# Patient Record
Sex: Female | Born: 1940 | Race: White | Hispanic: No | Marital: Married | Smoking: Never smoker
Health system: Southern US, Community
[De-identification: ages and names within clinical notes are randomized; demographics above are authoritative.]

## PROBLEM LIST (undated history)

## (undated) ENCOUNTER — Emergency Department: Discharge status: Absent without leave | Payer: Medicare HMO

## (undated) DIAGNOSIS — R200 Anesthesia of skin: Secondary | ICD-10-CM

## (undated) DIAGNOSIS — R519 Headache, unspecified: Secondary | ICD-10-CM

## (undated) DIAGNOSIS — T884XXA Failed or difficult intubation, initial encounter: Secondary | ICD-10-CM

## (undated) DIAGNOSIS — Z8709 Personal history of other diseases of the respiratory system: Secondary | ICD-10-CM

## (undated) DIAGNOSIS — I10 Essential (primary) hypertension: Secondary | ICD-10-CM

## (undated) DIAGNOSIS — D649 Anemia, unspecified: Secondary | ICD-10-CM

## (undated) DIAGNOSIS — R202 Paresthesia of skin: Secondary | ICD-10-CM

## (undated) DIAGNOSIS — T8859XA Other complications of anesthesia, initial encounter: Secondary | ICD-10-CM

## (undated) DIAGNOSIS — K219 Gastro-esophageal reflux disease without esophagitis: Secondary | ICD-10-CM

## (undated) DIAGNOSIS — R51 Headache: Secondary | ICD-10-CM

## (undated) DIAGNOSIS — E871 Hypo-osmolality and hyponatremia: Secondary | ICD-10-CM

## (undated) DIAGNOSIS — G2581 Restless legs syndrome: Secondary | ICD-10-CM

## (undated) DIAGNOSIS — R32 Unspecified urinary incontinence: Secondary | ICD-10-CM

## (undated) DIAGNOSIS — T4145XA Adverse effect of unspecified anesthetic, initial encounter: Secondary | ICD-10-CM

## (undated) DIAGNOSIS — G629 Polyneuropathy, unspecified: Secondary | ICD-10-CM

## (undated) DIAGNOSIS — M1990 Unspecified osteoarthritis, unspecified site: Secondary | ICD-10-CM

## (undated) DIAGNOSIS — F329 Major depressive disorder, single episode, unspecified: Secondary | ICD-10-CM

## (undated) DIAGNOSIS — E039 Hypothyroidism, unspecified: Secondary | ICD-10-CM

## (undated) DIAGNOSIS — Z8701 Personal history of pneumonia (recurrent): Secondary | ICD-10-CM

## (undated) DIAGNOSIS — E7800 Pure hypercholesterolemia, unspecified: Secondary | ICD-10-CM

## (undated) DIAGNOSIS — F419 Anxiety disorder, unspecified: Secondary | ICD-10-CM

## (undated) DIAGNOSIS — F32A Depression, unspecified: Secondary | ICD-10-CM

## (undated) DIAGNOSIS — R3915 Urgency of urination: Secondary | ICD-10-CM

## (undated) HISTORY — PX: TONSILLECTOMY: SUR1361

## (undated) HISTORY — PX: EYE SURGERY: SHX253

## (undated) HISTORY — PX: ABDOMINAL HYSTERECTOMY: SHX81

## (undated) HISTORY — PX: BREAST LUMPECTOMY: SHX2

## (undated) HISTORY — PX: SPINAL FUSION: SHX223

## (undated) HISTORY — PX: KNEE ARTHROSCOPY: SUR90

## (undated) HISTORY — PX: CERVICAL FUSION: SHX112

## (undated) HISTORY — PX: COLONOSCOPY: SHX174

## (undated) HISTORY — PX: OTHER SURGICAL HISTORY: SHX169

---

## 2003-02-13 ENCOUNTER — Ambulatory Visit (HOSPITAL_BASED_OUTPATIENT_CLINIC_OR_DEPARTMENT_OTHER): Admission: RE | Admit: 2003-02-13 | Discharge: 2003-02-13 | Payer: Self-pay | Admitting: Orthopaedic Surgery

## 2008-05-08 ENCOUNTER — Emergency Department (HOSPITAL_BASED_OUTPATIENT_CLINIC_OR_DEPARTMENT_OTHER): Admission: EM | Admit: 2008-05-08 | Discharge: 2008-05-08 | Payer: Self-pay | Admitting: Emergency Medicine

## 2011-01-23 NOTE — Op Note (Signed)
[name], Stacy Stanley                           ACCOUNT NO.:  [id number]   MEDICAL RECORD NO.:  [id number]                   PATIENT TYPE:  AMB   LOCATION:  DSC                                  FACILITY:  [facility]   PHYSICIAN:  Lubertha Basque. Jerl Santos, M.D.             DATE OF BIRTH:  1940-11-16   DATE OF PROCEDURE:  02/13/2003  DATE OF DISCHARGE:                                 OPERATIVE REPORT   PREOPERATIVE DIAGNOSES:  1. Right knee torn medial and torn lateral meniscus.  2. Right knee chondromalacia of the patella.   POSTOPERATIVE DIAGNOSES:  1. Right knee torn medial and torn lateral meniscus.  2. Right knee chondromalacia of the patella.   PROCEDURES:  1. Right knee partial medial and partial lateral meniscectomy.  2. Right knee arthroscopic chondroplasty of the patellofemoral compartment.   ANESTHESIA:  Knee block MAC.   ATTENDING SURGEON:  Lubertha Basque. Jerl Santos, M.D.   ASSISTANT:  Lindwood Qua, P.A.   INDICATION FOR PROCEDURE:  The patient is a 70 year old woman with several  months of intense right knee pain.  She has persisted with pain and an  effusion despite oral anti-inflammatories and rest.  She has undergone an  MRI scan, which does show medial and lateral meniscal tears and some  chondromalacia of the patella.  She also experienced some pain on the  opposite knee, and we evaluated that with an MRI scan which was relatively  normal.  She was offered an arthroscopy on the right with continued rest  pain and activity pain.  Informed operative consent was obtained after a  discussion of the possible complications of, reaction to anesthesia, and  infection.   DESCRIPTION OF PROCEDURE:  The patient was taken to the operating suite,  where a knee block was applied along with MAC.  She was positioned supine  and prepped and draped in the normal sterile fashion.  After administration  of preop IV antibiotics, an arthroscopy of the right knee was performed  through two  inferior portals.  The suprapatellar pouch was benign, while the  patellofemoral joint exhibited some grade 3 chondromalacia across the apex  of the patella.  A thorough chondroplasty was done to stable structures.  The kneecap did track very well.  In the medial compartment she had a flap  tear of the posterior horn of the medial meniscus, addressed with a 5%  partial medial meniscectomy.  She also had some grade 3 change across a  broad area of the medial femoral condyle, addressed with a brief  chondroplasty.  The lateral compartment exhibited no degenerative changes,  but she did have a significant anterior and middle horn lateral meniscal  tear, which appeared to displace into the joint.  This was addressed with a  20% partial lateral meniscectomy down to stable tissues.  The posterior horn  was completely benign.  The knee was thoroughly irrigated at the end of  the  case, followed by the placement of Marcaine with epinephrine and morphine.  Adaptic was placed over the wounds, followed by dry gauze and a loose Ace  wrap.  Estimated blood loss and intraoperative fluids can be obtained from  anesthesia records.    DISPOSITION:  The patient was taken to the recovery room in stable  condition.  Plans were for her to go home the same day and to follow up in  the office in less than a week.  I will contact her by phone tonight.                                               Lubertha Basque Jerl Santos, M.D.    PGD/MEDQ  D:  02/13/2003  T:  02/13/2003  Job:  [id number]

## 2016-06-26 ENCOUNTER — Other Ambulatory Visit: Payer: Self-pay | Admitting: Orthopaedic Surgery

## 2016-07-22 ENCOUNTER — Inpatient Hospital Stay (HOSPITAL_COMMUNITY)
Admission: RE | Admit: 2016-07-22 | Discharge: 2016-07-22 | Disposition: A | Discharge status: Home / Self Care | Payer: Self-pay | Source: Ambulatory Visit

## 2016-07-29 ENCOUNTER — Encounter (HOSPITAL_COMMUNITY)
Admission: RE | Admit: 2016-07-29 | Discharge: 2016-07-29 | Disposition: A | Discharge status: Home / Self Care | Payer: Medicare Other | Source: Ambulatory Visit | Attending: Orthopaedic Surgery | Admitting: Orthopaedic Surgery

## 2016-07-29 ENCOUNTER — Encounter (HOSPITAL_COMMUNITY): Payer: Self-pay

## 2016-07-29 ENCOUNTER — Ambulatory Visit (HOSPITAL_COMMUNITY)
Admission: RE | Admit: 2016-07-29 | Discharge: 2016-07-29 | Disposition: A | Discharge status: Home / Self Care | Payer: Medicare Other | Source: Ambulatory Visit | Attending: Orthopaedic Surgery | Admitting: Orthopaedic Surgery

## 2016-07-29 DIAGNOSIS — Z01818 Encounter for other preprocedural examination: Secondary | ICD-10-CM

## 2016-07-29 DIAGNOSIS — E039 Hypothyroidism, unspecified: Secondary | ICD-10-CM | POA: Insufficient documentation

## 2016-07-29 DIAGNOSIS — M1990 Unspecified osteoarthritis, unspecified site: Secondary | ICD-10-CM | POA: Insufficient documentation

## 2016-07-29 DIAGNOSIS — F329 Major depressive disorder, single episode, unspecified: Secondary | ICD-10-CM | POA: Diagnosis not present

## 2016-07-29 DIAGNOSIS — D649 Anemia, unspecified: Secondary | ICD-10-CM | POA: Diagnosis not present

## 2016-07-29 DIAGNOSIS — Z90710 Acquired absence of both cervix and uterus: Secondary | ICD-10-CM | POA: Diagnosis not present

## 2016-07-29 DIAGNOSIS — I517 Cardiomegaly: Secondary | ICD-10-CM | POA: Insufficient documentation

## 2016-07-29 DIAGNOSIS — I10 Essential (primary) hypertension: Secondary | ICD-10-CM | POA: Insufficient documentation

## 2016-07-29 DIAGNOSIS — R32 Unspecified urinary incontinence: Secondary | ICD-10-CM | POA: Diagnosis not present

## 2016-07-29 DIAGNOSIS — R05 Cough: Secondary | ICD-10-CM | POA: Insufficient documentation

## 2016-07-29 DIAGNOSIS — G629 Polyneuropathy, unspecified: Secondary | ICD-10-CM | POA: Diagnosis not present

## 2016-07-29 DIAGNOSIS — F419 Anxiety disorder, unspecified: Secondary | ICD-10-CM | POA: Insufficient documentation

## 2016-07-29 DIAGNOSIS — Z6841 Body Mass Index (BMI) 40.0 and over, adult: Secondary | ICD-10-CM | POA: Insufficient documentation

## 2016-07-29 DIAGNOSIS — E7800 Pure hypercholesterolemia, unspecified: Secondary | ICD-10-CM | POA: Insufficient documentation

## 2016-07-29 DIAGNOSIS — Z9889 Other specified postprocedural states: Secondary | ICD-10-CM | POA: Diagnosis not present

## 2016-07-29 HISTORY — DX: Major depressive disorder, single episode, unspecified: F32.9

## 2016-07-29 HISTORY — DX: Pure hypercholesterolemia, unspecified: E78.00

## 2016-07-29 HISTORY — DX: Anesthesia of skin: R20.0

## 2016-07-29 HISTORY — DX: Unspecified osteoarthritis, unspecified site: M19.90

## 2016-07-29 HISTORY — DX: Unspecified urinary incontinence: R32

## 2016-07-29 HISTORY — DX: Urgency of urination: R39.15

## 2016-07-29 HISTORY — DX: Restless legs syndrome: G25.81

## 2016-07-29 HISTORY — DX: Essential (primary) hypertension: I10

## 2016-07-29 HISTORY — DX: Failed or difficult intubation, initial encounter: T88.4XXA

## 2016-07-29 HISTORY — DX: Personal history of pneumonia (recurrent): Z87.01

## 2016-07-29 HISTORY — DX: Hypo-osmolality and hyponatremia: E87.1

## 2016-07-29 HISTORY — DX: Gastro-esophageal reflux disease without esophagitis: K21.9

## 2016-07-29 HISTORY — DX: Personal history of other diseases of the respiratory system: Z87.09

## 2016-07-29 HISTORY — DX: Headache, unspecified: R51.9

## 2016-07-29 HISTORY — DX: Depression, unspecified: F32.A

## 2016-07-29 HISTORY — DX: Anemia, unspecified: D64.9

## 2016-07-29 HISTORY — DX: Anxiety disorder, unspecified: F41.9

## 2016-07-29 HISTORY — DX: Hypothyroidism, unspecified: E03.9

## 2016-07-29 HISTORY — DX: Paresthesia of skin: R20.2

## 2016-07-29 HISTORY — DX: Polyneuropathy, unspecified: G62.9

## 2016-07-29 HISTORY — DX: Headache: R51

## 2016-07-29 LAB — BASIC METABOLIC PANEL
Anion gap: 8 (ref 5–15)
BUN: 39 mg/dL — ABNORMAL HIGH (ref 6–20)
CHLORIDE: 104 mmol/L (ref 101–111)
CO2: 26 mmol/L (ref 22–32)
Calcium: 9.9 mg/dL (ref 8.9–10.3)
Creatinine, Ser: 1.01 mg/dL — ABNORMAL HIGH (ref 0.44–1.00)
GFR, EST NON AFRICAN AMERICAN: 54 mL/min — AB (ref >60)
Glucose, Bld: 113 mg/dL — ABNORMAL HIGH (ref 65–99)
POTASSIUM: 4.5 mmol/L (ref 3.5–5.1)
SODIUM: 138 mmol/L (ref 135–145)

## 2016-07-29 LAB — CBC WITH DIFFERENTIAL/PLATELET
BASOS ABS: 0 10*3/uL (ref 0.0–0.1)
Basophils Relative: 0 %
EOS ABS: 0.2 10*3/uL (ref 0.0–0.7)
EOS PCT: 2 %
HCT: 35.8 % — ABNORMAL LOW (ref 36.0–46.0)
HEMOGLOBIN: 11.7 g/dL — AB (ref 12.0–15.0)
LYMPHS ABS: 3 10*3/uL (ref 0.7–4.0)
LYMPHS PCT: 27 %
MCH: 29.1 pg (ref 26.0–34.0)
MCHC: 32.7 g/dL (ref 30.0–36.0)
MCV: 89.1 fL (ref 78.0–100.0)
Monocytes Absolute: 1.1 10*3/uL — ABNORMAL HIGH (ref 0.1–1.0)
Monocytes Relative: 10 %
Neutro Abs: 6.7 10*3/uL (ref 1.7–7.7)
Neutrophils Relative %: 61 %
PLATELETS: 307 10*3/uL (ref 150–400)
RBC: 4.02 MIL/uL (ref 3.87–5.11)
RDW: 14.1 % (ref 11.5–15.5)
WBC: 11 10*3/uL — AB (ref 4.0–10.5)

## 2016-07-29 LAB — URINALYSIS, ROUTINE W REFLEX MICROSCOPIC
BILIRUBIN URINE: NEGATIVE
Glucose, UA: NEGATIVE mg/dL
HGB URINE DIPSTICK: NEGATIVE
KETONES UR: NEGATIVE mg/dL
Leukocytes, UA: NEGATIVE
NITRITE: NEGATIVE
PH: 6 (ref 5.0–8.0)
Protein, ur: NEGATIVE mg/dL
SPECIFIC GRAVITY, URINE: 1.011 (ref 1.005–1.030)

## 2016-07-29 LAB — TYPE AND SCREEN
ABO/RH(D): O POS
ANTIBODY SCREEN: NEGATIVE

## 2016-07-29 LAB — SURGICAL PCR SCREEN
MRSA, PCR: NEGATIVE
STAPHYLOCOCCUS AUREUS: NEGATIVE

## 2016-07-29 LAB — PROTIME-INR
INR: 1.02
PROTHROMBIN TIME: 13.4 s (ref 11.4–15.2)

## 2016-07-29 LAB — ABO/RH: ABO/RH(D): O POS

## 2016-07-29 LAB — APTT: APTT: 28 s (ref 24–36)

## 2016-07-29 NOTE — Progress Notes (Signed)
PCP - Dr. Josiah LoboJohn McFadden Cardiologist - denies Neurologist - Dr. Charmayne SheerEric Moser Urologist - Dr. Tobie Lordsimothy Mullins  EKG - 07/29/16 CXR - 07/29/16  Echo/Stress test/Cardiac Cath - denies  Patient denies chest pain and shortness of breath at PAT appointment.  Patient does states that she has developed a non-productive cough that worsens at night over the past 3-4 days.  She denies fever.  CXR was ordered by Dr. Jerl Santosalldorf.  Shonna ChockAllison Zelenak, PA consulted at PAT appointment.

## 2016-07-29 NOTE — Pre-Procedure Instructions (Signed)
Stacy HolsterLinda Stanley  07/29/2016      [id number] - [place], [place] - [address] [place] [id number] Phone: [phone] Fax: [phone]    Your procedure is scheduled on Tuesday, November 28th, 2017.  [facility]:30 A.M.   Call this number if you have problems the morning of surgery:  [phone]   Remember:  Do not eat food or drink liquids after midnight.   Take these medicines the morning of surgery with A SIP OF WATER: Acetaminophen (Tylenol) if needed, Baclofen (Lioresal) if needed, Buspirone (Buspar), Hydralazine (Apresoline), Labetalol (Normodyne), Levothyroxine (Synthroid), Loratadine (Claritin), Omeprazole (Prilosec), Pregabalin (Lyrica), Ropinirole (Requip), Tramadol (Ultram), Venlafaxine XR (Effexor XR).   Stop taking: Celecoxib (Celebrex), Aspirin, NSAIDS, Aleve, Naproxen, Ibuprofen, Advil, Motrin, BC's, Goody's, Fish oil, all herbal medications, and all vitamins.     Do not wear jewelry, make-up or nail polish.  Do not wear lotions, powders, or perfumes, or deoderant.  Do not shave 48 hours prior to surgery.    Do not bring valuables to the hospital.  [facility] is not responsible for any belongings or valuables.  Contacts, dentures or bridgework may not be worn into surgery.  Leave your suitcase in the car.  After surgery it may be brought to your room.  For patients admitted to the hospital, discharge time will be determined by your treatment team.  Patients discharged the day of surgery will not be allowed to drive home.   Special instructions:  Preparing for Surgery.   [facility]- Preparing For Surgery  Before surgery, you can play an important role. Because skin is not sterile, your skin needs to be as free of germs as possible. You can reduce the number of germs on your skin by washing with CHG (chlorahexidine gluconate) Soap before surgery.  CHG is  an antiseptic cleaner which kills germs and bonds with the skin to continue killing germs even after washing.  Please do not use if you have an allergy to CHG or antibacterial soaps. If your skin becomes reddened/irritated stop using the CHG.  Do not shave (including legs and underarms) for at least 48 hours prior to first CHG shower. It is OK to shave your face.  Please follow these instructions carefully.   1. Shower the NIGHT BEFORE SURGERY and the MORNING OF SURGERY with CHG.   2. If you chose to wash your hair, wash your hair first as usual with your normal shampoo.  3. After you shampoo, rinse your hair and body thoroughly to remove the shampoo.  4. Use CHG as you would any other liquid soap. You can apply CHG directly to the skin and wash gently with a scrungie or a clean washcloth.   5. Apply the CHG Soap to your body ONLY FROM THE NECK DOWN.  Do not use on open wounds or open sores. Avoid contact with your eyes, ears, mouth and genitals (private parts). Wash genitals (private parts) with your normal soap.  6. Wash thoroughly, paying special attention to the area where your surgery will be performed.  7. Thoroughly rinse your body with warm water from the neck down.  8. DO NOT shower/wash with your normal soap after using and rinsing off the CHG Soap.  9. Pat yourself dry with a CLEAN TOWEL.   10. Wear CLEAN PAJAMAS   11. Place CLEAN SHEETS on your bed the night of your first  shower and DO NOT SLEEP WITH PETS.  Day of Surgery: Do not apply any deodorants/lotions. Please wear clean clothes to the hospital/surgery center.      Please read over the following fact sheets that you were given. MRSA Information

## 2016-07-29 NOTE — Progress Notes (Signed)
Anesthesia PAT Evaluation: Patient is a 75 year old female scheduled for right TKA on 08/04/16 by Dr. Jerl Santosalldorf.  History includes non-smoker, peripheral neuropathy, HTN, hypothyroidism, hypercholesterolemia, arthritis, RLS, urinary incontinence, anxiety, depression, anemia, fall down stairs 03/2012 (comminuted C1 fracture, T5-6 compression fractures, T4-7 transverse fracture) s/p C1-2 fusion and T2-T8 spinal fusion 08/29/12, hysterectomy, tonsillectomy, left breast lumpectomy, Medtronic bladder stimulator. Reported issues with hyponatremia (with associated mental status changes) after 2013 surgery. BMI is consistent with morbid obesity.  She denied history of DIFFICULT AIRWAY, but fiberoptic intubation was used on 08/29/12 (but in the setting of known C2 fracture). Her neck now has limited ROM following C1-2, T2-8 fusion. She also has a small mouth and short neck. She has upper and lower partials. Based on her exam findings, I think she would be an anticipated DIFFICULT AIRWAY.  08/29/12 Anesthesia Record [facility]; Care Everywhere): Alexander Mtaniel Douglas Kovacs, MD 12/23/20138:41 AM AIRWAY PROCEDURAL NOTE Airway Type:Difficult POSITIONING: Note: Ability to Mask Ventilate Patient:Other LARYNGOSCOPY: Blade(s) Used: Other  Attempts: One ETT Size/Location: 7.0, Oral and Cuffed Secured with: Fabric Tape Secured at (cm): 23 End Tidal C02 is positive and breath sounds equal bilaterally  ENDOTBRONCHIAL TUBE: Difficult Airway Protocol Indications: Unstable Cervical Spine Fiberoptic Intubation:Required after induction   PCP is Dr. Josiah LoboJohn McFadden with [facility]; see Care Everywhere), last visit 07/07/16. Urologist is Dr. Tobie Lordsimothy Mullins. Neurologist is Dr. Charmayne SheerEric Moser (for peripheral neuropathy).  Meds include Effexor XR, trazodone, tramadol, Topamax, Detrol LA, Nucynta ER, Lipitor, baclofen, Buspar, Omnicef (on chronically for UTI prevention), Celebrex, Vitamin D, 65 Fe, hydralazine,  labetalol, levothyroxine, loratadine, losartan, Singulair, fish oil, Prilosec, Lyrica, Requip.  BP (!) 178/68   Pulse 71   Temp 36.7 C   Resp 20   Ht 5' (1.524 m)   Wt 206 lb 12.8 oz (93.8 kg)   SpO2 97%   BMI 40.39 kg/m   Heart RRR, no murmur noted. Lungs sounds slightly diminished, but no wheezes, rhonchi, or crackles noted. No carotid bruits noted. Trace pre-tibial edema. Voice is slightly raspy. She reported a non-productive cough for the past 3-4 days. She does not think that she's had a fever. No sick contacts. No sore throat, but feels there is sinus drainage. She is using Mucinex. She denied SOB. She denied cardiac issues or known cardiac history. No history of stress, echo or cath. She has chronic mild BLE edema. She is able to lie flat to sleep. Activity is limited due to right knee pain.   07/29/16 EKG: SR with first degree AV block.  07/29/16 CXR: FINDINGS: Mediastinum and hilar structures are normal. Borderline cardiomegaly. No focal infiltrate. No pleural effusion or pneumothorax. Prior thoracic spine fusion. IMPRESSION: 1. Mild cardiomegaly.  No pulmonary venous congestion. 2. No focal pulmonary infiltrate.  Preoperative labs noted. Cr 1.01, Na 138, WBC 11.0, H/H 11.7/35.8. PT/PTT WNL. Glucose 113. UA WNL.   Patient with occasional dry cough and sinus drainage for the past few days. No known fever, SOB, wheezes. Lung sounds are clear this afternoon. CXR and UA today are OK. Non-toxic appearing. No conversational dyspnea. No coughing noted during my time with her.  If symptoms continue to improve then I think she can likely proceed with surgery from an anesthesia standpoint; however, I did advise her to contact Dr. Nolon Nationsalldorf's office by 08/03/16 if she had persistent/worsening symptoms and to contact Dr. Annett GulaMcFadden's office as needed for worsening symptoms. I also left a voice message for Agustin CreeKathy Blume at Dr. Nolon Nationsalldorf's office regarding patient's  symptoms, CXR, UA, and WBC results.  Patient also notified that her surgery could be canceled if she presented with sick symptoms on the day of surgery. She has a Medtronic bladder stimulator near her "buttocks", but denied lower back surgeries, so we discussed potential for spinal anesthesia. We also discussed that she may require specialized airway equipment (due to her limited neck mobility) for future procedures that required ETT. Anesthesia plan to be discussed further with her by her assigned anesthesiologist on the day of surgery.  Velna Ochsllison Branden Shallenberger, PA-C [facility] Short Stay Center/Anesthesiology Phone [phone] 07/29/2016 5:20 PM

## 2016-08-03 NOTE — H&P (Signed)
TOTAL KNEE ADMISSION H&P  Patient is being admitted for right total knee arthroplasty.  Subjective:  Chief Complaint:right knee pain.  HPI: Stacy Stanley, 75 y.o. female, has a history of pain and functional disability in the right knee due to arthritis and has failed non-surgical conservative treatments for greater than 12 weeks to includeNSAID's and/or analgesics, corticosteriod injections, viscosupplementation injections, flexibility and strengthening excercises, use of assistive devices, weight reduction as appropriate and activity modification.  Onset of symptoms was gradual, starting 5 years ago with gradually worsening course since that time. The patient noted prior procedures on the knee to include  arthroscopy on the right knee(s).  Patient currently rates pain in the right knee(s) at 10 out of 10 with activity. Patient has night pain, worsening of pain with activity and weight bearing, pain that interferes with activities of daily living, crepitus and joint swelling.  Patient has evidence of subchondral cysts, subchondral sclerosis, periarticular osteophytes and joint space narrowing by imaging studies. There is no active infection.  There are no active problems to display for this patient.  Past Medical History:  Diagnosis Date  . Anemia   . Anxiety   . Arthritis   . Depression   . Difficult intubation    fiberoptic intubation in the setting of C2 fx 08/29/12; now s/p C1-2 and T2-8 fusion (has limited neck ROM)  . GERD (gastroesophageal reflux disease)   . Headache   . High cholesterol    controlled with Atorvastatin  . History of bronchitis   . History of pneumonia   . Hypertension   . Hyponatremia    states that sodium level drops after surgery  . Hypothyroidism   . Numbness and tingling    hands and feet  . Peripheral neuropathy (HCC)   . Restless leg syndrome   . Urinary incontinence   . Urinary urgency     Past Surgical History:  Procedure Laterality Date  .  ABDOMINAL HYSTERECTOMY    . BREAST LUMPECTOMY Left   . CERVICAL FUSION    . COLONOSCOPY    . EYE SURGERY Bilateral    cataracts  . KNEE ARTHROSCOPY Bilateral   . Medtronic Bladder stimulator insertion    . SPINAL FUSION     posterior  . TONSILLECTOMY      No prescriptions prior to admission.   Allergies  Allergen Reactions  . Adhesive [Tape] Itching and Rash    Social History  Substance Use Topics  . Smoking status: Never Smoker  . Smokeless tobacco: Never Used  . Alcohol use No    No family history on file.   Review of Systems  Musculoskeletal: Positive for joint pain.       Right knee  All other systems reviewed and are negative.   Objective:  Physical Exam  Constitutional: She is oriented to person, place, and time. She appears well-developed and well-nourished.  HENT:  Head: Normocephalic and atraumatic.  Eyes: Pupils are equal, round, and reactive to light.  Neck: Normal range of motion.  Cardiovascular: Normal rate.   Respiratory: Effort normal.  GI: Soft.  Musculoskeletal:  Right knee is 0-95. She has pain along the medial joint line and some crepitation. Do not feel an effusion. Hip motion is good and straight leg raise is negative. Sensation and motor function are intact in her feet with palpable pulses on both sides.   Neurological: She is alert and oriented to person, place, and time.  Skin: Skin is warm and dry.  Psychiatric: She  has a normal mood and affect. Her behavior is normal. Judgment and thought content normal.    Vital signs in last 24 hours:    Labs:   Estimated body mass index is 40.39 kg/m as calculated from the following:   Height as of 07/29/16: 5' (1.524 m).   Weight as of 07/29/16: 93.8 kg (206 lb 12.8 oz).   Imaging Review Plain radiographs demonstrate severe degenerative joint disease of the right knee(s). The overall alignment isneutral. The bone quality appears to be good for age and reported activity  level.  Assessment/Plan:  End stage primary arthritis, right knee   The patient history, physical examination, clinical judgment of the provider and imaging studies are consistent with end stage degenerative joint disease of the right knee(s) and total knee arthroplasty is deemed medically necessary. The treatment options including medical management, injection therapy arthroscopy and arthroplasty were discussed at length. The risks and benefits of total knee arthroplasty were presented and reviewed. The risks due to aseptic loosening, infection, stiffness, patella tracking problems, thromboembolic complications and other imponderables were discussed. The patient acknowledged the explanation, agreed to proceed with the plan and consent was signed. Patient is being admitted for inpatient treatment for surgery, pain control, PT, OT, prophylactic antibiotics, VTE prophylaxis, progressive ambulation and ADL's and discharge planning. The patient is planning to be discharged home with home health services

## 2016-08-04 ENCOUNTER — Inpatient Hospital Stay (HOSPITAL_COMMUNITY)
Admission: RE | Admit: 2016-08-04 | Discharge: 2016-08-10 | DRG: 470 | Disposition: A | Discharge status: Skilled Nursing Facility | Payer: Medicare Other | Source: Ambulatory Visit | Attending: Orthopaedic Surgery | Admitting: Orthopaedic Surgery

## 2016-08-04 ENCOUNTER — Inpatient Hospital Stay (HOSPITAL_COMMUNITY): Payer: Medicare Other | Admitting: Anesthesiology

## 2016-08-04 ENCOUNTER — Inpatient Hospital Stay (HOSPITAL_COMMUNITY): Payer: Medicare Other | Admitting: Emergency Medicine

## 2016-08-04 ENCOUNTER — Encounter (HOSPITAL_COMMUNITY)
Admission: RE | Disposition: A | Discharge status: Skilled Nursing Facility | Payer: Self-pay | Source: Ambulatory Visit | Attending: Orthopaedic Surgery

## 2016-08-04 ENCOUNTER — Encounter (HOSPITAL_COMMUNITY): Payer: Self-pay | Admitting: Anesthesiology

## 2016-08-04 DIAGNOSIS — K219 Gastro-esophageal reflux disease without esophagitis: Secondary | ICD-10-CM | POA: Diagnosis present

## 2016-08-04 DIAGNOSIS — Z6841 Body Mass Index (BMI) 40.0 and over, adult: Secondary | ICD-10-CM

## 2016-08-04 DIAGNOSIS — I10 Essential (primary) hypertension: Secondary | ICD-10-CM | POA: Diagnosis present

## 2016-08-04 DIAGNOSIS — Z981 Arthrodesis status: Secondary | ICD-10-CM | POA: Diagnosis not present

## 2016-08-04 DIAGNOSIS — E7800 Pure hypercholesterolemia, unspecified: Secondary | ICD-10-CM | POA: Diagnosis present

## 2016-08-04 DIAGNOSIS — M545 Low back pain: Secondary | ICD-10-CM | POA: Diagnosis present

## 2016-08-04 DIAGNOSIS — E039 Hypothyroidism, unspecified: Secondary | ICD-10-CM | POA: Diagnosis present

## 2016-08-04 DIAGNOSIS — H5711 Ocular pain, right eye: Secondary | ICD-10-CM | POA: Diagnosis present

## 2016-08-04 DIAGNOSIS — G2581 Restless legs syndrome: Secondary | ICD-10-CM | POA: Diagnosis present

## 2016-08-04 DIAGNOSIS — G8929 Other chronic pain: Secondary | ICD-10-CM | POA: Diagnosis present

## 2016-08-04 DIAGNOSIS — R262 Difficulty in walking, not elsewhere classified: Secondary | ICD-10-CM

## 2016-08-04 DIAGNOSIS — M1711 Unilateral primary osteoarthritis, right knee: Secondary | ICD-10-CM | POA: Diagnosis present

## 2016-08-04 DIAGNOSIS — Z96651 Presence of right artificial knee joint: Secondary | ICD-10-CM

## 2016-08-04 DIAGNOSIS — M25561 Pain in right knee: Secondary | ICD-10-CM

## 2016-08-04 HISTORY — PX: TOTAL KNEE ARTHROPLASTY: SHX125

## 2016-08-04 SURGERY — ARTHROPLASTY, KNEE, TOTAL
Anesthesia: Spinal | Site: Knee | Laterality: Right

## 2016-08-04 MED ORDER — MENTHOL 3 MG MT LOZG: 1.0000 | LOZENGE | OROMUCOSAL | Status: DC | PRN

## 2016-08-04 MED ORDER — BUPIVACAINE LIPOSOME 1.3 % IJ SUSP
INTRAMUSCULAR | Status: DC | PRN
  Administered 2016-08-04: 20 mL

## 2016-08-04 MED ORDER — TRANEXAMIC ACID 1000 MG/10ML IV SOLN
INTRAVENOUS | Status: DC | PRN
  Administered 2016-08-04: 2000 mg via TOPICAL

## 2016-08-04 MED ORDER — BUPIVACAINE-EPINEPHRINE (PF) 0.25% -1:200000 IJ SOLN
INTRAMUSCULAR | Status: DC | PRN
  Administered 2016-08-04: 20 mL

## 2016-08-04 MED ORDER — PROPOFOL 10 MG/ML IV BOLUS
INTRAVENOUS | Status: AC
  Filled 2016-08-04: qty 40

## 2016-08-04 MED ORDER — PANTOPRAZOLE SODIUM 40 MG PO TBEC
80.0000 mg | DELAYED_RELEASE_TABLET | Freq: Every day | ORAL | Status: DC
  Administered 2016-08-05 – 2016-08-10 (×6): 80 mg via ORAL
  Filled 2016-08-04 (×6): qty 2

## 2016-08-04 MED ORDER — LEVOTHYROXINE SODIUM 112 MCG PO TABS
112.0000 ug | ORAL_TABLET | Freq: Every day | ORAL | Status: DC
  Administered 2016-08-05 – 2016-08-10 (×6): 112 ug via ORAL
  Filled 2016-08-04 (×6): qty 1

## 2016-08-04 MED ORDER — ASPIRIN EC 325 MG PO TBEC
325.0000 mg | DELAYED_RELEASE_TABLET | Freq: Two times a day (BID) | ORAL | Status: DC
  Administered 2016-08-05 – 2016-08-10 (×11): 325 mg via ORAL
  Filled 2016-08-04 (×11): qty 1

## 2016-08-04 MED ORDER — ROPINIROLE HCL 0.5 MG PO TABS
0.5000 mg | ORAL_TABLET | Freq: Every day | ORAL | Status: DC
  Administered 2016-08-04 – 2016-08-09 (×6): 0.5 mg via ORAL
  Filled 2016-08-04 (×6): qty 1

## 2016-08-04 MED ORDER — PREGABALIN 100 MG PO CAPS: 200.0000 mg | ORAL_CAPSULE | Freq: Two times a day (BID) | ORAL | Status: DC

## 2016-08-04 MED ORDER — BUSPIRONE HCL 10 MG PO TABS
10.0000 mg | ORAL_TABLET | Freq: Two times a day (BID) | ORAL | Status: DC
  Administered 2016-08-04 – 2016-08-10 (×12): 10 mg via ORAL
  Filled 2016-08-04 (×12): qty 1

## 2016-08-04 MED ORDER — CEFAZOLIN SODIUM-DEXTROSE 2-4 GM/100ML-% IV SOLN
2.0000 g | Freq: Four times a day (QID) | INTRAVENOUS | Status: AC
  Administered 2016-08-04 (×2): 2 g via INTRAVENOUS
  Filled 2016-08-04 (×2): qty 100

## 2016-08-04 MED ORDER — LABETALOL HCL 300 MG PO TABS
300.0000 mg | ORAL_TABLET | Freq: Three times a day (TID) | ORAL | Status: DC
Start: 2016-08-04 — End: 2016-08-10
  Administered 2016-08-04 – 2016-08-10 (×17): 300 mg via ORAL
  Filled 2016-08-04 (×20): qty 1

## 2016-08-04 MED ORDER — PREGABALIN 100 MG PO CAPS
400.0000 mg | ORAL_CAPSULE | Freq: Every day | ORAL | Status: DC
  Administered 2016-08-04 – 2016-08-09 (×6): 400 mg via ORAL
  Filled 2016-08-04 (×6): qty 4

## 2016-08-04 MED ORDER — BUPIVACAINE IN DEXTROSE 0.75-8.25 % IT SOLN
INTRATHECAL | Status: DC | PRN
  Administered 2016-08-04: 1.6 mL via INTRATHECAL

## 2016-08-04 MED ORDER — METHOCARBAMOL 1000 MG/10ML IJ SOLN
500.0000 mg | Freq: Four times a day (QID) | INTRAVENOUS | Status: DC | PRN
  Administered 2016-08-04: 500 mg via INTRAVENOUS
  Filled 2016-08-04 (×2): qty 5

## 2016-08-04 MED ORDER — SODIUM CHLORIDE 0.9 % IJ SOLN
INTRAMUSCULAR | Status: DC | PRN
  Administered 2016-08-04: 40 mL

## 2016-08-04 MED ORDER — FENTANYL CITRATE (PF) 100 MCG/2ML IJ SOLN
25.0000 ug | INTRAMUSCULAR | Status: DC | PRN
  Administered 2016-08-04 (×3): 50 ug via INTRAVENOUS

## 2016-08-04 MED ORDER — TAPENTADOL HCL 50 MG PO TABS
75.0000 mg | ORAL_TABLET | Freq: Two times a day (BID) | ORAL | Status: DC
  Administered 2016-08-04 – 2016-08-06 (×5): 75 mg via ORAL
  Filled 2016-08-04 (×4): qty 2

## 2016-08-04 MED ORDER — SODIUM CHLORIDE 0.9 % IV SOLN
2000.0000 mg | INTRAVENOUS | Status: DC
  Filled 2016-08-04: qty 20

## 2016-08-04 MED ORDER — CEFAZOLIN SODIUM-DEXTROSE 2-4 GM/100ML-% IV SOLN
2.0000 g | INTRAVENOUS | Status: AC
  Administered 2016-08-04: 2 g via INTRAVENOUS
  Filled 2016-08-04: qty 100

## 2016-08-04 MED ORDER — BACLOFEN 10 MG PO TABS
10.0000 mg | ORAL_TABLET | Freq: Four times a day (QID) | ORAL | Status: DC | PRN
  Administered 2016-08-08 – 2016-08-10 (×2): 10 mg via ORAL
  Filled 2016-08-04 (×2): qty 1

## 2016-08-04 MED ORDER — HYDROMORPHONE HCL 2 MG/ML IJ SOLN
INTRAMUSCULAR | Status: AC
  Filled 2016-08-04: qty 1

## 2016-08-04 MED ORDER — ONDANSETRON HCL 4 MG PO TABS: 4.0000 mg | ORAL_TABLET | Freq: Four times a day (QID) | ORAL | Status: DC | PRN

## 2016-08-04 MED ORDER — SODIUM CHLORIDE 0.9 % IR SOLN
Status: DC | PRN
  Administered 2016-08-04: 3000 mL

## 2016-08-04 MED ORDER — BUPIVACAINE-EPINEPHRINE (PF) 0.25% -1:200000 IJ SOLN
INTRAMUSCULAR | Status: AC
  Filled 2016-08-04: qty 30

## 2016-08-04 MED ORDER — MIDAZOLAM HCL 2 MG/2ML IJ SOLN
INTRAMUSCULAR | Status: AC
  Filled 2016-08-04: qty 2

## 2016-08-04 MED ORDER — LACTATED RINGERS IV SOLN
INTRAVENOUS | Status: DC
  Administered 2016-08-04: 15:00:00 via INTRAVENOUS

## 2016-08-04 MED ORDER — LACTATED RINGERS IV SOLN
INTRAVENOUS | Status: DC
  Administered 2016-08-04: 07:00:00 via INTRAVENOUS

## 2016-08-04 MED ORDER — HYDROCODONE-ACETAMINOPHEN 5-325 MG PO TABS
1.0000 | ORAL_TABLET | ORAL | Status: DC | PRN
  Administered 2016-08-04 – 2016-08-05 (×3): 2 via ORAL
  Filled 2016-08-04 (×5): qty 2

## 2016-08-04 MED ORDER — MIDAZOLAM HCL 5 MG/5ML IJ SOLN
INTRAMUSCULAR | Status: DC | PRN
  Administered 2016-08-04 (×2): 0.5 mg via INTRAVENOUS

## 2016-08-04 MED ORDER — 0.9 % SODIUM CHLORIDE (POUR BTL) OPTIME
TOPICAL | Status: DC | PRN
Start: 2016-08-04 — End: 2016-08-04
  Administered 2016-08-04: 1000 mL

## 2016-08-04 MED ORDER — MEPERIDINE HCL 25 MG/ML IJ SOLN: 6.2500 mg | INTRAMUSCULAR | Status: DC | PRN

## 2016-08-04 MED ORDER — PREGABALIN 100 MG PO CAPS
200.0000 mg | ORAL_CAPSULE | Freq: Every morning | ORAL | Status: DC
  Administered 2016-08-05 – 2016-08-10 (×6): 200 mg via ORAL
  Filled 2016-08-04 (×6): qty 2

## 2016-08-04 MED ORDER — FENTANYL CITRATE (PF) 100 MCG/2ML IJ SOLN
INTRAMUSCULAR | Status: DC | PRN
  Administered 2016-08-04 (×2): 25 ug via INTRAVENOUS

## 2016-08-04 MED ORDER — HYDRALAZINE HCL 50 MG PO TABS
50.0000 mg | ORAL_TABLET | Freq: Three times a day (TID) | ORAL | Status: DC
  Administered 2016-08-04 – 2016-08-10 (×19): 50 mg via ORAL
  Filled 2016-08-04 (×19): qty 1

## 2016-08-04 MED ORDER — HYDROCODONE-ACETAMINOPHEN 5-325 MG PO TABS
ORAL_TABLET | ORAL | Status: AC
  Filled 2016-08-04: qty 2

## 2016-08-04 MED ORDER — ATORVASTATIN CALCIUM 20 MG PO TABS
20.0000 mg | ORAL_TABLET | Freq: Every day | ORAL | Status: DC
  Administered 2016-08-05 – 2016-08-09 (×5): 20 mg via ORAL
  Filled 2016-08-04 (×6): qty 1

## 2016-08-04 MED ORDER — CEFDINIR 125 MG/5ML PO SUSR
300.0000 mg | ORAL | Status: DC
  Administered 2016-08-05 – 2016-08-09 (×3): 300 mg via ORAL
  Filled 2016-08-04 (×3): qty 15

## 2016-08-04 MED ORDER — ACETAMINOPHEN 650 MG RE SUPP: 650.0000 mg | Freq: Four times a day (QID) | RECTAL | Status: DC | PRN

## 2016-08-04 MED ORDER — FENTANYL CITRATE (PF) 100 MCG/2ML IJ SOLN
INTRAMUSCULAR | Status: AC
  Administered 2016-08-04: 50 ug via INTRAVENOUS
  Filled 2016-08-04: qty 2

## 2016-08-04 MED ORDER — METOCLOPRAMIDE HCL 5 MG PO TABS: 5.0000 mg | ORAL_TABLET | Freq: Three times a day (TID) | ORAL | Status: DC | PRN

## 2016-08-04 MED ORDER — METOCLOPRAMIDE HCL 5 MG/ML IJ SOLN: 5.0000 mg | Freq: Three times a day (TID) | INTRAMUSCULAR | Status: DC | PRN

## 2016-08-04 MED ORDER — FESOTERODINE FUMARATE ER 8 MG PO TB24
8.0000 mg | ORAL_TABLET | Freq: Every day | ORAL | Status: DC
  Administered 2016-08-05 – 2016-08-10 (×6): 8 mg via ORAL
  Filled 2016-08-04 (×7): qty 1

## 2016-08-04 MED ORDER — HYDROMORPHONE HCL 2 MG/ML IJ SOLN
0.5000 mg | INTRAMUSCULAR | Status: DC | PRN
  Administered 2016-08-04: 1 mg via INTRAVENOUS
  Administered 2016-08-08: 0.5 mg via INTRAVENOUS
  Filled 2016-08-04 (×2): qty 1

## 2016-08-04 MED ORDER — TRAZODONE HCL 50 MG PO TABS
50.0000 mg | ORAL_TABLET | Freq: Every day | ORAL | Status: DC
Start: 2016-08-04 — End: 2016-08-10
  Administered 2016-08-04 – 2016-08-09 (×7): 50 mg via ORAL
  Filled 2016-08-04 (×7): qty 1

## 2016-08-04 MED ORDER — TRANEXAMIC ACID 1000 MG/10ML IV SOLN
1000.0000 mg | INTRAVENOUS | Status: AC
  Administered 2016-08-04: 1000 mg via INTRAVENOUS
  Filled 2016-08-04: qty 10

## 2016-08-04 MED ORDER — FENTANYL CITRATE (PF) 100 MCG/2ML IJ SOLN
INTRAMUSCULAR | Status: AC
Start: 2016-08-04 — End: 2016-08-04
  Filled 2016-08-04: qty 4

## 2016-08-04 MED ORDER — ONDANSETRON HCL 4 MG/2ML IJ SOLN
INTRAMUSCULAR | Status: DC | PRN
  Administered 2016-08-04: 4 mg via INTRAVENOUS

## 2016-08-04 MED ORDER — LOSARTAN POTASSIUM 50 MG PO TABS
100.0000 mg | ORAL_TABLET | Freq: Every day | ORAL | Status: DC
  Administered 2016-08-05 – 2016-08-10 (×6): 100 mg via ORAL
  Filled 2016-08-04 (×6): qty 2

## 2016-08-04 MED ORDER — DOCUSATE SODIUM 100 MG PO CAPS
100.0000 mg | ORAL_CAPSULE | Freq: Two times a day (BID) | ORAL | Status: DC
  Administered 2016-08-04 – 2016-08-09 (×10): 100 mg via ORAL
  Filled 2016-08-04 (×12): qty 1

## 2016-08-04 MED ORDER — TAPENTADOL HCL ER 150 MG PO TB12: 150.0000 mg | ORAL_TABLET | Freq: Every day | ORAL | Status: DC

## 2016-08-04 MED ORDER — PHENOL 1.4 % MT LIQD: 1.0000 | OROMUCOSAL | Status: DC | PRN

## 2016-08-04 MED ORDER — LORATADINE 10 MG PO TABS
10.0000 mg | ORAL_TABLET | Freq: Every day | ORAL | Status: DC
  Administered 2016-08-05 – 2016-08-10 (×6): 10 mg via ORAL
  Filled 2016-08-04 (×6): qty 1

## 2016-08-04 MED ORDER — BISACODYL 5 MG PO TBEC
5.0000 mg | DELAYED_RELEASE_TABLET | Freq: Every day | ORAL | Status: DC | PRN
  Administered 2016-08-07 – 2016-08-09 (×2): 5 mg via ORAL
  Filled 2016-08-04 (×2): qty 1

## 2016-08-04 MED ORDER — VENLAFAXINE HCL ER 75 MG PO CP24
75.0000 mg | ORAL_CAPSULE | Freq: Every day | ORAL | Status: DC
  Administered 2016-08-05 – 2016-08-10 (×6): 75 mg via ORAL
  Filled 2016-08-04 (×6): qty 1

## 2016-08-04 MED ORDER — CHLORHEXIDINE GLUCONATE 4 % EX LIQD: 60.0000 mL | Freq: Once | CUTANEOUS | Status: DC

## 2016-08-04 MED ORDER — HYDROMORPHONE HCL 1 MG/ML IJ SOLN
0.5000 mg | INTRAMUSCULAR | Status: DC | PRN
  Administered 2016-08-04 (×2): 0.5 mg via INTRAVENOUS

## 2016-08-04 MED ORDER — ROPINIROLE HCL 0.5 MG PO TABS
0.2500 mg | ORAL_TABLET | Freq: Two times a day (BID) | ORAL | Status: DC
  Administered 2016-08-04 – 2016-08-10 (×11): 0.25 mg via ORAL
  Filled 2016-08-04 (×11): qty 1

## 2016-08-04 MED ORDER — TOPIRAMATE 100 MG PO TABS: 100.0000 mg | ORAL_TABLET | Freq: Every day | ORAL | Status: DC | PRN

## 2016-08-04 MED ORDER — ONDANSETRON HCL 4 MG/2ML IJ SOLN: 4.0000 mg | Freq: Four times a day (QID) | INTRAMUSCULAR | Status: DC | PRN

## 2016-08-04 MED ORDER — TRANEXAMIC ACID 1000 MG/10ML IV SOLN
1000.0000 mg | Freq: Once | INTRAVENOUS | Status: DC
  Filled 2016-08-04 (×2): qty 10

## 2016-08-04 MED ORDER — TAPENTADOL HCL 75 MG PO TABS: 75.0000 mg | ORAL_TABLET | Freq: Two times a day (BID) | ORAL | Status: DC

## 2016-08-04 MED ORDER — FENTANYL CITRATE (PF) 100 MCG/2ML IJ SOLN
INTRAMUSCULAR | Status: AC
  Filled 2016-08-04: qty 2

## 2016-08-04 MED ORDER — ALUM & MAG HYDROXIDE-SIMETH 200-200-20 MG/5ML PO SUSP: 30.0000 mL | ORAL | Status: DC | PRN

## 2016-08-04 MED ORDER — BUPIVACAINE LIPOSOME 1.3 % IJ SUSP
20.0000 mL | INTRAMUSCULAR | Status: DC
  Filled 2016-08-04: qty 20

## 2016-08-04 MED ORDER — METHOCARBAMOL 500 MG PO TABS
500.0000 mg | ORAL_TABLET | Freq: Four times a day (QID) | ORAL | Status: DC | PRN
  Administered 2016-08-07 – 2016-08-10 (×5): 500 mg via ORAL
  Filled 2016-08-04 (×7): qty 1

## 2016-08-04 MED ORDER — MONTELUKAST SODIUM 10 MG PO TABS
10.0000 mg | ORAL_TABLET | Freq: Every day | ORAL | Status: DC
  Administered 2016-08-04 – 2016-08-09 (×6): 10 mg via ORAL
  Filled 2016-08-04 (×6): qty 1

## 2016-08-04 MED ORDER — ACETAMINOPHEN 325 MG PO TABS: 650.0000 mg | ORAL_TABLET | Freq: Four times a day (QID) | ORAL | Status: DC | PRN

## 2016-08-04 MED ORDER — METOCLOPRAMIDE HCL 5 MG/ML IJ SOLN: 10.0000 mg | Freq: Once | INTRAMUSCULAR | Status: DC | PRN

## 2016-08-04 MED ORDER — DIPHENHYDRAMINE HCL 12.5 MG/5ML PO ELIX: 12.5000 mg | ORAL_SOLUTION | ORAL | Status: DC | PRN

## 2016-08-04 SURGICAL SUPPLY — 67 items
BAG DECANTER FOR FLEXI CONT (MISCELLANEOUS) ×3 IMPLANT
BANDAGE ACE 4X5 VEL STRL LF (GAUZE/BANDAGES/DRESSINGS) ×3 IMPLANT
BANDAGE ELASTIC 4 VELCRO ST LF (GAUZE/BANDAGES/DRESSINGS) ×3 IMPLANT
BANDAGE ESMARK 6X9 LF (GAUZE/BANDAGES/DRESSINGS) ×1 IMPLANT
BLADE SAGITTAL 25.0X1.19X90 (BLADE) ×2 IMPLANT
BLADE SAGITTAL 25.0X1.19X90MM (BLADE) ×1
BLADE SAW SGTL 13.0X1.19X90.0M (BLADE) IMPLANT
BLADE SURG ROTATE 9660 (MISCELLANEOUS) IMPLANT
BNDG ELASTIC 6X10 VLCR STRL LF (GAUZE/BANDAGES/DRESSINGS) ×3 IMPLANT
BNDG ESMARK 6X9 LF (GAUZE/BANDAGES/DRESSINGS) ×3
BNDG GAUZE ELAST 4 BULKY (GAUZE/BANDAGES/DRESSINGS) ×3 IMPLANT
BOWL SMART MIX CTS (DISPOSABLE) ×3 IMPLANT
CAP KNEE TOTAL 3 SIGMA ×3 IMPLANT
CEMENT HV SMART SET (Cement) ×6 IMPLANT
CLOSURE WOUND 1/2 X4 (GAUZE/BANDAGES/DRESSINGS) ×1
COVER SURGICAL LIGHT HANDLE (MISCELLANEOUS) ×3 IMPLANT
CUFF TOURNIQUET SINGLE 34IN LL (TOURNIQUET CUFF) ×3 IMPLANT
CUFF TOURNIQUET SINGLE 44IN (TOURNIQUET CUFF) IMPLANT
DECANTER SPIKE VIAL GLASS SM (MISCELLANEOUS) IMPLANT
DRAPE EXTREMITY T 121X128X90 (DRAPE) ×3 IMPLANT
DRAPE HALF SHEET 40X57 (DRAPES) IMPLANT
DRAPE PROXIMA HALF (DRAPES) ×6 IMPLANT
DRAPE U-SHAPE 47X51 STRL (DRAPES) ×3 IMPLANT
DRSG ADAPTIC 3X8 NADH LF (GAUZE/BANDAGES/DRESSINGS) ×3 IMPLANT
DRSG PAD ABDOMINAL 8X10 ST (GAUZE/BANDAGES/DRESSINGS) ×3 IMPLANT
DURAPREP 26ML APPLICATOR (WOUND CARE) ×6 IMPLANT
ELECT REM PT RETURN 9FT ADLT (ELECTROSURGICAL) ×3
ELECTRODE REM PT RTRN 9FT ADLT (ELECTROSURGICAL) ×1 IMPLANT
GAUZE SPONGE 4X4 12PLY STRL (GAUZE/BANDAGES/DRESSINGS) ×3 IMPLANT
GLOVE BIO SURGEON STRL SZ8 (GLOVE) ×6 IMPLANT
GLOVE BIOGEL PI IND STRL 8 (GLOVE) ×2 IMPLANT
GLOVE BIOGEL PI INDICATOR 8 (GLOVE) ×4
GOWN STRL REUS W/ TWL LRG LVL3 (GOWN DISPOSABLE) ×1 IMPLANT
GOWN STRL REUS W/ TWL XL LVL3 (GOWN DISPOSABLE) ×2 IMPLANT
GOWN STRL REUS W/TWL LRG LVL3 (GOWN DISPOSABLE) ×2
GOWN STRL REUS W/TWL XL LVL3 (GOWN DISPOSABLE) ×4
HANDPIECE INTERPULSE COAX TIP (DISPOSABLE) ×2
HOOD PEEL AWAY FACE SHEILD DIS (HOOD) ×6 IMPLANT
IMMOBILIZER KNEE 22 UNIV (SOFTGOODS) ×3 IMPLANT
KIT BASIN OR (CUSTOM PROCEDURE TRAY) ×3 IMPLANT
KIT ROOM TURNOVER OR (KITS) ×3 IMPLANT
MANIFOLD NEPTUNE II (INSTRUMENTS) ×3 IMPLANT
NEEDLE 22X1 1/2 (OR ONLY) (NEEDLE) ×3 IMPLANT
NEEDLE HYPO 21X1 ECLIPSE (NEEDLE) IMPLANT
NS IRRIG 1000ML POUR BTL (IV SOLUTION) ×3 IMPLANT
PACK TOTAL JOINT (CUSTOM PROCEDURE TRAY) ×3 IMPLANT
PACK UNIVERSAL I (CUSTOM PROCEDURE TRAY) IMPLANT
PAD ARMBOARD 7.5X6 YLW CONV (MISCELLANEOUS) ×6 IMPLANT
SET HNDPC FAN SPRY TIP SCT (DISPOSABLE) ×1 IMPLANT
STAPLER VISISTAT 35W (STAPLE) IMPLANT
STRIP CLOSURE SKIN 1/2X4 (GAUZE/BANDAGES/DRESSINGS) ×2 IMPLANT
SUCTION FRAZIER HANDLE 10FR (MISCELLANEOUS)
SUCTION TUBE FRAZIER 10FR DISP (MISCELLANEOUS) IMPLANT
SUT MNCRL AB 3-0 PS2 18 (SUTURE) ×3 IMPLANT
SUT VIC AB 0 CT1 27 (SUTURE) ×4
SUT VIC AB 0 CT1 27XBRD ANBCTR (SUTURE) ×2 IMPLANT
SUT VIC AB 2-0 CT1 27 (SUTURE) ×6
SUT VIC AB 2-0 CT1 TAPERPNT 27 (SUTURE) ×3 IMPLANT
SUT VLOC 180 0 24IN GS25 (SUTURE) ×3 IMPLANT
SYR 50ML LL SCALE MARK (SYRINGE) ×3 IMPLANT
TOWEL OR 17X24 6PK STRL BLUE (TOWEL DISPOSABLE) ×3 IMPLANT
TOWEL OR 17X26 10 PK STRL BLUE (TOWEL DISPOSABLE) ×3 IMPLANT
TRAY CATH 16FR W/PLASTIC CATH (SET/KITS/TRAYS/PACK) IMPLANT
TRAY FOLEY CATH 14FR (SET/KITS/TRAYS/PACK) IMPLANT
TRAY FOLEY CATH SILVER 16FR (SET/KITS/TRAYS/PACK) ×3 IMPLANT
UPCHARGE REV TRAY MBT KNEE ×3 IMPLANT
WATER STERILE IRR 1000ML POUR (IV SOLUTION) IMPLANT

## 2016-08-04 NOTE — Op Note (Signed)
PREOP DIAGNOSIS: DJD RIGHT KNEE POSTOP DIAGNOSIS: same PROCEDURE: RIGHT TKR ANESTHESIA: Spinal and MAC ATTENDING SURGEON: Cooper Stamp G ASSISTANT: [name] PA  INDICATIONS FOR PROCEDURE: Stacy Stanley is a 75 y.o. female who has struggled for a long time with pain due to degenerative arthritis of the right knee.  The patient has failed many conservative non-operative measures and at this point has pain which limits the ability to sleep and walk.  The patient is offered total knee replacement.  Informed operative consent was obtained after discussion of possible risks of anesthesia, infection, neurovascular injury, DVT, and death.  The importance of the post-operative rehabilitation protocol to optimize result was stressed extensively with the patient.  SUMMARY OF FINDINGS AND PROCEDURE:  Stacy Stanley was taken to the operative suite where under the above anesthesia a right knee replacement was performed.  There were advanced degenerative changes and the bone quality was fair.  We used the DePuy LCS system and placed size standard femur, 2.5 MBT revision tibia, 32 mm all polyethylene patella, and a size 10 mm spacer.  [name] PA-C assisted throughout and was invaluable to the completion of the case in that he helped retract and maintain exposure while I placed components.  He also helped close thereby minimizing OR time.  The patient was admitted for appropriate post-op care to include perioperative antibiotics and mechanical and pharmacologic measures for DVT prophylaxis.  DESCRIPTION OF PROCEDURE:  Stacy Stanley was taken to the operative suite where the above anesthesia was applied.  The patient was positioned supine and prepped and draped in normal sterile fashion.  An appropriate time out was performed.  After the administration of kefzol pre-op antibiotic the leg was elevated and exsanguinated and a tourniquet inflated. A standard longitudinal incision was made on the anterior knee.   Dissection was carried down to the extensor mechanism.  All appropriate anti-infective measures were used including the pre-operative antibiotic, betadine impregnated drape, and closed hooded exhaust systems for each member of the surgical team.  A medial parapatellar incision was made in the extensor mechanism and the knee cap flipped and the knee flexed.  Some residual meniscal tissues were removed along with any remaining ACL/PCL tissue.  A guide was placed on the tibia and a flat cut was made on it's superior surface.  An intramedullary guide was placed in the femur and was utilized to make anterior and posterior cuts creating an appropriate flexion gap.  A second intramedullary guide was placed in the femur to make a distal cut properly balancing the knee with an extension gap equal to the flexion gap.  The three bones sized to the above mentioned sizes and the appropriate guides were placed and utilized.  A trial reduction was done and the knee easily came to full extension and the patella tracked well on flexion.  The trial components were removed and all bones were cleaned with pulsatile lavage and then dried thoroughly.  Cement was mixed and was pressurized onto the bones followed by placement of the aforementioned components.  Excess cement was trimmed and pressure was held on the components until the cement had hardened.  The tourniquet was deflated and a small amount of bleeding was controlled with cautery and pressure.  The knee was irrigated thoroughly.  The extensor mechanism was re-approximated with V-loc suture in running fashion.  The knee was flexed and the repair was solid.  The subcutaneous tissues were re-approximated with #0 and #2-0 vicryl and the skin closed with a subcuticular  stitch and steristrips.  A sterile dressing was applied.  Intraoperative fluids, EBL, and tourniquet time can be obtained from anesthesia records.  DISPOSITION:  The patient was taken to recovery room in stable  condition and admitted for appropriate post-op care to include peri-operative antibiotic and DVT prophylaxis with mechanical and pharmacologic measures.  Stacy Stanley G 08/04/2016, 9:27 AM

## 2016-08-04 NOTE — Progress Notes (Signed)
Orthopedic Tech Progress Note Patient Details:  Stacy HolsterLinda Stanley 12/07/1940 [id number]  CPM Right Knee CPM Right Knee: On Right Knee Flexion (Degrees): 90 Right Knee Extension (Degrees): 0 Additional Comments: foot roll   Saul FordyceJennifer C Amal Renbarger 08/04/2016, 10:25 AM

## 2016-08-04 NOTE — Anesthesia Procedure Notes (Deleted)
Spinal

## 2016-08-04 NOTE — Anesthesia Procedure Notes (Addendum)
Anesthesia Regional Block:  Adductor canal block  Pre-Anesthetic Checklist: ,, timeout performed, Correct Patient, Correct Site, Correct Laterality, Correct Procedure, Correct Position, site marked, Risks and benefits discussed,  Surgical consent,  Pre-op evaluation,  At surgeon's request and post-op pain management  Laterality: Right  Prep: chloraprep       Needles:  Injection technique: Single-shot  Needle Type: Echogenic Needle     Needle Length: 9cm 9 cm Needle Gauge: 21 and 21 G    Additional Needles:  Procedures: ultrasound guided (picture in chart) Adductor canal block Narrative:  Start time: 08/04/2016 7:29 AM End time: 08/04/2016 7:33 AM Injection made incrementally with aspirations every 5 mL.  Performed by: Personally  Anesthesiologist: Shona SimpsonHOLLIS, KEVIN D  Additional Notes: No immediate complications. Pt tolerated well.

## 2016-08-04 NOTE — Interval H&P Note (Signed)
History and Physical Interval Note:  08/04/2016 7:14 AM  Stacy HolsterLinda Stanley  has presented today for surgery, with the diagnosis of RIGHT KNEE DEGENERATIVE JOINT DISEASE  The various methods of treatment have been discussed with the patient and family. After consideration of risks, benefits and other options for treatment, the patient has consented to  Procedure(s): TOTAL KNEE ARTHROPLASTY (Right) as a surgical intervention .  The patient's history has been reviewed, patient examined, no change in status, stable for surgery.  I have reviewed the patient's chart and labs.  Questions were answered to the patient's satisfaction.     Bron Snellings G

## 2016-08-04 NOTE — Evaluation (Signed)
Physical Therapy Evaluation Patient Details Name: Stacy Stanley MRN: [id number] DOB: 07/20/1941 Today's Date: 08/04/2016   History of Present Illness  Patient is a 75 y/o female s/p R TKA; PMH positive for GERD, HTN, peripheral neuropathy, C1-2&T2-8 fusion, RLS, HLD, Anemia, and anxiety.  Clinical Impression  Patient presents with decreased independence with mobility due to deficits listed in PT problem list.  Patient with VSS throughout session, but was groggy from anesthesia/meds and had difficulty staying awake to do exercises and was more unsteady requiring multimodal cues for transfers and safety.  She should improve to d/c home with family support and follow up HHPT.     Follow Up Recommendations Home health PT    Equipment Recommendations  None recommended by PT    Recommendations for Other Services       Precautions / Restrictions Precautions Precautions: Fall;Knee Required Braces or Orthoses: Knee Immobilizer - Right Restrictions Weight Bearing Restrictions: Yes RLE Weight Bearing: Weight bearing as tolerated      Mobility  Bed Mobility Overal bed mobility: Needs Assistance Bed Mobility: Supine to Sit     Supine to sit: Min assist     General bed mobility comments: assist to support trunk, pt used rail to lift trunk, increased time, cues for scooting to EOB  Transfers Overall transfer level: Needs assistance Equipment used: Rolling walker (2 wheeled) Transfers: Sit to/from [employer] CorporationStand;Stand Pivot Transfers Sit to Stand: Mod assist Stand pivot transfers: Min assist       General transfer comment: lifting help with R knee immobilizer and pt pulling up on walker; assist to take steps to turn to sit on recliner  Ambulation/Gait                Stairs            Wheelchair Mobility    Modified Rankin (Stroke Patients Only)       Balance Overall balance assessment: Needs assistance Sitting-balance support: Feet supported Sitting balance-Leahy  Scale: Good     Standing balance support: Bilateral upper extremity supported Standing balance-Leahy Scale: Poor Standing balance comment: UE support needed for balance                             Pertinent Vitals/Pain Pain Assessment: Faces Faces Pain Scale: Hurts even more Pain Location: R knee when in CPM Pain Descriptors / Indicators: Operative site guarding;Grimacing Pain Intervention(s): Monitored during session;Repositioned    Home Living Family/patient expects to be discharged to:: Private residence Living Arrangements: Spouse/significant other Available Help at Discharge: Family Type of Home: House Home Access: Stairs to enter   Secretary/administratorntrance Stairs-Number of Steps: 2 Home Layout: One level Home Equipment: Environmental consultantWalker - 2 wheels;Grab bars - tub/shower      Prior Function Level of Independence: Independent               Hand Dominance        Extremity/Trunk Assessment               Lower Extremity Assessment: RLE deficits/detail RLE Deficits / Details: ankle AROM WFL, knee flexion AAROM about 40 and painful, extension about -15, unable to coordinate for quad set, limited by lethargy due to meds       Communication   Communication: No difficulties  Cognition Arousal/Alertness: Lethargic;Suspect due to medications Behavior During Therapy: [facility] for tasks assessed/performed Overall Cognitive Status: Within Functional Limits for tasks assessed  General Comments      Exercises Total Joint Exercises Ankle Circles/Pumps: AROM;Both;10 reps;Supine Quad Sets: AAROM;Right;5 reps;Limitations;Supine Quad Sets Limitations: unable to coordinate for knee press, kept doing hamstring set despite multimodal cues and increased time. Heel Slides: AAROM;Right;5 reps;Supine   Assessment/Plan    PT Assessment Patient needs continued PT services  PT Problem List Decreased range of motion;Decreased strength;Decreased  balance;Pain;Decreased knowledge of use of DME;Decreased mobility;Decreased knowledge of precautions;Decreased activity tolerance          PT Treatment Interventions DME instruction;Gait training;Balance training;Functional mobility training;Stair training;Therapeutic exercise;Therapeutic activities;Patient/family education    PT Goals (Current goals can be found in the Care Plan section)  Acute Rehab PT Goals Patient Stated Goal: To return to independent PT Goal Formulation: With patient Time For Goal Achievement: 08/11/16 Potential to Achieve Goals: Good    Frequency 7X/week   Barriers to discharge        Co-evaluation               End of Session Equipment Utilized During Treatment: Gait belt;Right knee immobilizer Activity Tolerance: Patient limited by lethargy Patient left: in chair;with call bell/phone within reach;with chair alarm set           Time: 1605-1630 PT Time Calculation (min) (ACUTE ONLY): 25 min   Charges:   PT Evaluation $PT Eval Moderate Complexity: 1 Procedure PT Treatments $Therapeutic Activity: 8-22 mins   PT G CodesElray Stanley:        Stacy Stanley 08/04/2016, 5:06 PM  Stacy Stanley, PT [phone] 08/04/2016

## 2016-08-04 NOTE — Anesthesia Preprocedure Evaluation (Signed)
Anesthesia Evaluation   Patient awake    Reviewed: Allergy & Precautions, NPO status , Patient's Chart, lab work & pertinent test results, reviewed documented beta blocker date and time   History of Anesthesia Complications (+) DIFFICULT AIRWAY and history of anesthetic complications  Airway Mallampati: III  TM Distance: >3 FB Neck ROM: Limited    Dental no notable dental hx. (+) Teeth Intact   Pulmonary asthma ,    Pulmonary exam normal breath sounds clear to auscultation       Cardiovascular Exercise Tolerance: Poor hypertension, Pt. on medications and Pt. on home beta blockers Normal cardiovascular exam Rhythm:Regular Rate:Normal     Neuro/Psych  Headaches, PSYCHIATRIC DISORDERS Anxiety Depression Restless legs syndrome  Neuromuscular disease    GI/Hepatic GERD  Medicated and Controlled,  Endo/Other  Hypothyroidism Morbid obesityHyperlipidemia Sodium drops post op  Renal/GU Renal InsufficiencyRenal disease Bladder dysfunction  Overactive bladder    Musculoskeletal  (+) Arthritis , Osteoarthritis,  OA right Knee S/P cervical fusion- limited ROM Chronic LBP   Abdominal (+) + obese,   Peds  Hematology  (+) anemia ,   Anesthesia Other Findings   Reproductive/Obstetrics                             Lab Results  Component Value Date   WBC 11.0 (H) 07/29/2016   HGB 11.7 (L) 07/29/2016   HCT 35.8 (L) 07/29/2016   MCV 89.1 07/29/2016   PLT 307 07/29/2016     Chemistry      Component Value Date/Time   NA 138 07/29/2016 1533   K 4.5 07/29/2016 1533   CL 104 07/29/2016 1533   CO2 26 07/29/2016 1533   BUN 39 (H) 07/29/2016 1533   CREATININE 1.01 (H) 07/29/2016 1533      Component Value Date/Time   CALCIUM 9.9 07/29/2016 1533     EKG: normal sinus rhythm, 1st degree AV block.  Anesthesia Physical Anesthesia Plan  ASA: III  Anesthesia Plan: Spinal   Post-op Pain  Management:    Induction:   Airway Management Planned: Natural Airway and Nasal Cannula  Additional Equipment:   Intra-op Plan:   Post-operative Plan:   Informed Consent: I have reviewed the patients History and Physical, chart, labs and discussed the procedure including the risks, benefits and alternatives for the proposed anesthesia with the patient or authorized representative who has indicated his/her understanding and acceptance.   Dental advisory given  Plan Discussed with: Anesthesiologist, CRNA and Surgeon  Anesthesia Plan Comments:         Anesthesia Quick Evaluation

## 2016-08-04 NOTE — Anesthesia Postprocedure Evaluation (Signed)
Anesthesia Post Note  Patient: Stacy HolsterLinda Stanley  Procedure(s) Performed: Procedure(s) (LRB): TOTAL KNEE ARTHROPLASTY (Right)  Patient location during evaluation: PACU Anesthesia Type: General Level of consciousness: awake and alert and oriented Pain management: pain level controlled Vital Signs Assessment: post-procedure vital signs reviewed and stable Respiratory status: spontaneous breathing, nonlabored ventilation, respiratory function stable and patient connected to nasal cannula oxygen Cardiovascular status: blood pressure returned to baseline and stable Postop Assessment: no backache, no headache, spinal receding, patient able to bend at knees and no signs of nausea or vomiting Anesthetic complications: no    Last Vitals:  Vitals:   08/04/16 1022 08/04/16 1037  BP: (!) 134/58 (!) 159/60  Pulse: 63 63  Resp: 15 14  Temp:      Last Pain:  Vitals:   08/04/16 1006  TempSrc:   PainSc: 0-No pain                 Kroy Sprung A.

## 2016-08-04 NOTE — Anesthesia Procedure Notes (Signed)
Procedure Name: MAC Date/Time: 08/04/2016 7:38 AM Performed by: Donette LarryMEYER, Laterica Matarazzo E Oxygen Delivery Method: Simple face mask Placement Confirmation: positive ETCO2

## 2016-08-04 NOTE — Anesthesia Procedure Notes (Addendum)
Spinal  Patient location during procedure: OR Start time: 08/04/2016 7:49 AM Staffing Anesthesiologist: Mal AmabileFOSTER, Bryant Saye Performed: anesthesiologist  Preanesthetic Checklist Completed: patient identified, site marked, surgical consent, pre-op evaluation, timeout performed, IV checked, risks and benefits discussed and monitors and equipment checked Spinal Block Patient position: sitting Prep: DuraPrep Patient monitoring: heart rate, cardiac monitor, continuous pulse ox and blood pressure Approach: midline Location: L4-5 Injection technique: single-shot Needle Needle type: Pencan  Needle gauge: 24 G Needle length: 9 cm Needle insertion depth: 6 cm Assessment Sensory level: T6 Additional Notes Difficult due to previous back surgery, poor landmarks and MO. Attempt x 3. Patient tolerated procedure well. Adequate sensory level.

## 2016-08-04 NOTE — Transfer of Care (Signed)
Immediate Anesthesia Transfer of Care Note  Patient: Stacy HolsterLinda Stanley  Procedure(s) Performed: Procedure(s): TOTAL KNEE ARTHROPLASTY (Right)  Patient Location: PACU  Anesthesia Type:Spinal  Level of Consciousness: awake, patient cooperative and responds to stimulation  Airway & Oxygen Therapy: Patient Spontanous Breathing and Patient connected to nasal cannula oxygen  Post-op Assessment: Report given to RN and Post -op Vital signs reviewed and stable  Post vital signs: Reviewed and stable  Last Vitals:  Vitals:   08/04/16 0655 08/04/16 1006  BP: (!) 146/48   Pulse: 66   Resp: 18   Temp: 36.9 C 36.2 C    Last Pain:  Vitals:   08/04/16 1006  TempSrc:   PainSc: 0-No pain         Complications: No apparent anesthesia complications

## 2016-08-04 NOTE — Progress Notes (Signed)
Orthopedic Tech Progress Note Patient Details:  Sharee HolsterLinda Bucker 02/05/1941 [id number] Trapeze bar     Saul FordyceJennifer C Mishawn Didion 08/04/2016, 10:39 AM

## 2016-08-05 ENCOUNTER — Encounter (HOSPITAL_COMMUNITY): Payer: Self-pay | Admitting: Orthopaedic Surgery

## 2016-08-05 NOTE — Progress Notes (Signed)
Orthopedic Tech Progress Note Patient Details:  Stacy Stanley 08/08/1941 [id number]  Patient ID: Stacy Stanley, female   DOB: 12/15/1940, 75 y.o.   MRN: [id number] Pt wants to wait to get in cpm. Will call when ready.  Trinna PostMartinez, Malerie Eakins J 08/05/2016, 5:30 AM

## 2016-08-05 NOTE — [place] FL2 (Signed)
[place] MEDICAID FL2 LEVEL OF CARE SCREENING TOOL     IDENTIFICATION  Patient Name: Stacy HolsterLinda Stanley Birthdate: 06/15/1941 Sex: female Admission Date (Current Location): 08/04/2016  [facility] [place] [place]   Facility and Address:  [facility], [address], [place], [place] [address]      Provider Number: [id number]  Attending Physician Name and Address:  Stacy CorningPeter Dalldorf, MD  Relative Name and Phone Number:       Current Level of Care: Hospital Recommended Level of Care: Skilled Nursing Facility Prior Approval Number:    Date Approved/Denied: 08/05/16 PASRR Number: [id number]  Discharge Plan: SNF    Current Diagnoses: Patient Active Problem List   Diagnosis Date Noted  . Primary osteoarthritis of right knee 08/04/2016    Orientation RESPIRATION BLADDER Height & Weight     Self, Time, Situation, Place  Normal Incontinent Weight: 206 lb (93.4 kg) Height:     BEHAVIORAL SYMPTOMS/MOOD NEUROLOGICAL BOWEL NUTRITION STATUS      Continent Diet (Heart healthy/carb modified; thin liquids. Subject to change please check d/c summary)  AMBULATORY STATUS COMMUNICATION OF NEEDS Skin   Extensive Assist Verbally Surgical wounds (Closed incision knee, compression wrap)                       Personal Care Assistance Level of Assistance  Bathing, Feeding, Dressing Bathing Assistance: Maximum assistance Feeding assistance: Independent Dressing Assistance: Maximum assistance     Functional Limitations Info  Sight, Hearing, Speech Sight Info: Adequate Hearing Info: Adequate Speech Info: Adequate    SPECIAL CARE FACTORS FREQUENCY  PT (By licensed PT), OT (By licensed OT)     PT Frequency: 7x week OT Frequency: 7x week            Contractures Contractures Info: Not present    Additional Factors Info  Code Status, Allergies Code Status Info: Full Allergies Info: Adhesive (Tape)           Current Medications  (08/05/2016):  This is the current hospital active medication list Current Facility-Administered Medications  Medication Dose Route Frequency Provider Last Rate Last Dose  . acetaminophen (TYLENOL) tablet 650 mg  650 mg Oral Q6H PRN [name], PA-C       Or  . acetaminophen (TYLENOL) suppository 650 mg  650 mg Rectal Q6H PRN [name], PA-C      . alum & mag hydroxide-simeth (MAALOX/MYLANTA) 200-200-20 MG/5ML suspension 30 mL  30 mL Oral Q4H PRN [name], PA-C      . aspirin EC tablet 325 mg  325 mg Oral BID PC [name], PA-C   325 mg at 08/05/16 0943  . atorvastatin (LIPITOR) tablet 20 mg  20 mg Oral q1800 [name], PA-C      . baclofen (LIORESAL) tablet 10 mg  10 mg Oral QID PRN [name], PA-C      . bisacodyl (DULCOLAX) EC tablet 5 mg  5 mg Oral Daily PRN [name], PA-C      . busPIRone (BUSPAR) tablet 10 mg  10 mg Oral BID [name], PA-C   10 mg at 08/05/16 0943  . cefdinir (OMNICEF) 125 MG/5ML suspension 300 mg  300 mg Oral Julianne RiceQODAY Andrew Nida, PA-C   300 mg at 08/05/16 0944  . diphenhydrAMINE (BENADRYL) 12.5 MG/5ML elixir 12.5-25 mg  12.5-25 mg Oral Q4H PRN [name], PA-C      . docusate sodium (COLACE) capsule 100 mg  100 mg Oral  BID [name], PA-C   100 mg at 08/05/16 [id number]  . fesoterodine (TOVIAZ) tablet 8 mg  8 mg Oral Daily [name], PA-C   8 mg at 08/05/16 0944  . hydrALAZINE (APRESOLINE) tablet 50 mg  50 mg Oral TID [name], PA-C   50 mg at 08/05/16 0943  . HYDROcodone-acetaminophen (NORCO/VICODIN) 5-325 MG per tablet 1-2 tablet  1-2 tablet Oral Q4H PRN [name], PA-C   2 tablet at 08/05/16 0941  . HYDROmorphone (DILAUDID) injection 0.5-1 mg  0.5-1 mg Intravenous Q3H PRN [name], PA-C   1 mg at 08/04/16 2024  . labetalol (NORMODYNE) tablet 300 mg  300 mg Oral TID [name], PA-C   300 mg at 08/05/16 0940  . lactated ringers infusion   Intravenous Continuous [name], PA-C 50 mL/hr at 08/04/16 1445    . levothyroxine (SYNTHROID, LEVOTHROID)  tablet 112 mcg  112 mcg Oral QAC breakfast Elodia Florencendrew Nida, PA-C   112 mcg at 08/05/16 [id number]  . loratadine (CLARITIN) tablet 10 mg  10 mg Oral Daily [name], PA-C   10 mg at 08/05/16 0943  . losartan (COZAAR) tablet 100 mg  100 mg Oral Daily [name], PA-C   100 mg at 08/05/16 0940  . menthol-cetylpyridinium (CEPACOL) lozenge 3 mg  1 lozenge Oral PRN [name], PA-C       Or  . phenol (CHLORASEPTIC) mouth spray 1 spray  1 spray Mouth/Throat PRN [name], PA-C      . methocarbamol (ROBAXIN) tablet 500 mg  500 mg Oral Q6H PRN [name], PA-C       Or  . methocarbamol (ROBAXIN) 500 mg in dextrose 5 % 50 mL IVPB  500 mg Intravenous Q6H PRN [name], PA-C   500 mg at 08/04/16 1147  . metoCLOPramide (REGLAN) tablet 5-10 mg  5-10 mg Oral Q8H PRN [name], PA-C       Or  . metoCLOPramide (REGLAN) injection 5-10 mg  5-10 mg Intravenous Q8H PRN [name], PA-C      . montelukast (SINGULAIR) tablet 10 mg  10 mg Oral QHS [name], PA-C   10 mg at 08/04/16 2236  . ondansetron (ZOFRAN) tablet 4 mg  4 mg Oral Q6H PRN [name], PA-C       Or  . ondansetron [facility](ZOFRAN) injection 4 mg  4 mg Intravenous Q6H PRN [name], PA-C      . pantoprazole (PROTONIX) EC tablet 80 mg  80 mg Oral Daily Elodia Florencendrew Nida, PA-C   80 mg at 08/05/16 0941  . pregabalin (LYRICA) capsule 200 mg  200 mg Oral q morning - 10a Stacy CorningPeter Dalldorf, MD   200 mg at 08/05/16 [id number]   And  . pregabalin (LYRICA) capsule 400 mg  400 mg Oral QHS Stacy CorningPeter Dalldorf, MD   400 mg at 08/04/16 2236  . rOPINIRole (REQUIP) tablet 0.25 mg  0.25 mg Oral BID [name], PA-C   0.25 mg at 08/04/16 1823  . rOPINIRole (REQUIP) tablet 0.5 mg  0.5 mg Oral QHS Stacy CorningPeter Dalldorf, MD   0.5 mg at 08/04/16 2236  . tapentadol (NUCYNTA) tablet 75 mg  75 mg Oral BID Stacy CorningPeter Dalldorf, MD   75 mg at 08/05/16 0946  . topiramate (TOPAMAX) tablet 100 mg  100 mg Oral Daily PRN [name], PA-C      . tranexamic acid (CYKLOKAPRON) 1,000 mg in sodium chloride 0.9 % 100  mL IVPB  1,000 mg Intravenous Once [name], PA-C      .  traZODone (DESYREL) tablet 50-100 mg  50-100 mg Oral QHS Elodia Florence, PA-C   50 mg at 08/04/16 2237  . venlafaxine XR (EFFEXOR-XR) 24 hr capsule 75 mg  75 mg Oral Q breakfast Elodia Florence, PA-C   75 mg at 08/05/16 8469     Discharge Medications: Please see discharge summary for a list of discharge medications.  Relevant Imaging Results:  Relevant Lab Results:   Additional Information SSN: [id number]  Volney American, LCSW

## 2016-08-05 NOTE — Progress Notes (Signed)
Orthopedic Tech Progress Note Patient Details:  Stacy Stanley 10/19/1940 [id number]  Patient ID: Stacy HolsterLinda Stanley, female   DOB: 09/22/1940, 75 y.o.   MRN: [id number]   Stacy Stanley, Stacy Stanley 08/05/2016, 1:34 PM Placed pt's rle on cpm @0 -40 degrees @1330 ; will increase as pt tolerates; RN notified

## 2016-08-05 NOTE — Evaluation (Signed)
Occupational Therapy Evaluation Patient Details Name: Stacy HolsterLinda Stanley MRN: [id number] DOB: 10/15/1940 Today's Date: 08/05/2016    History of Present Illness Patient is a 75 y/o female s/p R TKA; PMH positive for GERD, HTN, peripheral neuropathy, C1-2&T2-8 fusion, RLS, HLD, Anemia, and anxiety.   Clinical Impression   Pt with decline in function and safety with ADLs and ADL mobility with decreased strength, balance, endurance and cognition. Pt would benefit from acute OT services to address impairments to increase level of function and safety    Follow Up Recommendations  SNF    Equipment Recommendations  Other (comment) (TBD at next venue of care)    Recommendations for Other Services       Precautions / Restrictions Precautions Precautions: Fall;Knee Required Braces or Orthoses: Knee Immobilizer - Right Restrictions Weight Bearing Restrictions: Yes RLE Weight Bearing: Weight bearing as tolerated      Mobility Bed Mobility Overal bed mobility: Needs Assistance Bed Mobility: Sit to Supine       Sit to supine: +2 for physical assistance;+2 for safety/equipment;Max assist   General bed mobility comments: assist with trunk and bil LEs   Transfers Overall transfer level: Needs assistance Equipment used: Rolling walker (2 wheeled) Transfers: Sit to/from UGI CorporationStand;Stand Pivot Transfers Sit to Stand: Max assist;+2 physical assistance;+2 safety/equipment Stand pivot transfers: Max assist;+2 physical assistance;+2 safety/equipment       General transfer comment: pt requiring incr time and multi-modal cues for hand placement, wt shift, safety; assist for anterior-superior translation,  with RW position, and to prevent posterior LOB  throughout stand pivot, chair to pt to prevent fall;     Balance Overall balance assessment: Needs assistance   Sitting balance-Leahy Scale: Fair       Standing balance-Leahy Scale: Zero                              ADL  Overall ADL's : Needs assistance/impaired     Grooming: Wash/dry hands;Wash/dry face;Minimal assistance;Sitting   Upper Body Bathing: Moderate assistance   Lower Body Bathing: Total assistance   Upper Body Dressing : Moderate assistance   Lower Body Dressing: Maximal assistance;+2 for safety/equipment;+2 for physical assistance Lower Body Dressing Details (indicate cue type and reason): simulated      Toileting- Clothing Manipulation and Hygiene: Total assistance       Functional mobility during ADLs: Moderate assistance;+2 for physical assistance;+2 for safety/equipment       Vision Vision Assessment?: No apparent visual deficits              Pertinent Vitals/Pain Pain Assessment: 0-10 Faces Pain Scale: Hurts little more Pain Location: R knee Pain Descriptors / Indicators: Guarding;Grimacing Pain Intervention(s): Limited activity within patient's tolerance;Monitored during session;Repositioned     Hand Dominance Right   Extremity/Trunk Assessment Upper Extremity Assessment Upper Extremity Assessment: Generalized weakness   Lower Extremity Assessment Lower Extremity Assessment: Defer to PT evaluation       Communication Communication Communication: No difficulties   Cognition Arousal/Alertness: Lethargic;Suspect due to medications Behavior During Therapy: [facility] LLCWFL for tasks assessed/performed Overall Cognitive Status: Impaired/Different from baseline Area of Impairment: Following commands;Problem solving       Following Commands: Follows one step commands inconsistently     Problem Solving: Slow processing;Decreased initiation;Difficulty sequencing;Requires verbal cues;Requires tactile cues     General Comments   pt pleasant and cooperative                 Home  Living Family/patient expects to be discharged to:: Private residence Living Arrangements: Spouse/significant other Available Help at Discharge: Family Type of Home: Teaching laboratory technicianHouse   Entrance  Stairs-Number of Steps: 2   Home Layout: One level     Bathroom Shower/Tub: Walk-in Human resources officershower   Bathroom Toilet: Handicapped height     Home Equipment: Environmental consultantWalker - 2 wheels;Grab bars - tub/shower          Prior Functioning/Environment Level of Independence: Independent                 OT Problem List: Decreased strength;Impaired balance (sitting and/or standing);Decreased cognition;Pain;Decreased safety awareness;Obesity;Decreased activity tolerance;Decreased knowledge of use of DME or AE   OT Treatment/Interventions: DME and/or AE instruction;Self-care/ADL training;Therapeutic activities;Patient/family education    OT Goals(Current goals can be found in the care plan section) Acute Rehab OT Goals Patient Stated Goal: To return to independent OT Goal Formulation: With patient/family Time For Goal Achievement: 08/12/16 Potential to Achieve Goals: Good ADL Goals Pt Will Perform Grooming: with min guard assist;sitting Pt Will Perform Upper Body Bathing: with min assist;with min guard assist;sitting Pt Will Perform Lower Body Bathing: with mod assist;sitting/lateral leans;sit to/from stand Pt Will Perform Upper Body Dressing: with min assist;with min guard assist;sitting Pt Will Transfer to Toilet: with mod assist;with min assist;bedside commode  OT Frequency: Min 2X/week   Barriers to D/C: Decreased caregiver support          Co-evaluation PT/OT/SLP Co-Evaluation/Treatment: Yes Reason for Co-Treatment: For patient/therapist safety;Necessary to address cognition/behavior during functional activity PT goals addressed during session: Mobility/safety with mobility;Proper use of DME        End of Session Equipment Utilized During Treatment: Gait belt;Rolling walker CPM Right Knee CPM Right Knee: Off  Activity Tolerance: Patient limited by lethargy;Patient limited by pain Patient left: in bed;with call bell/phone within reach;with bed alarm set;with family/visitor present    Time: 1217-1242 OT Time Calculation (min): 25 min Charges:  OT General Charges $OT Visit: 1 Procedure OT Evaluation $OT Eval Moderate Complexity: 1 Procedure G-Codes:    Galen ManilaSpencer, Walter Min Jeanette 08/05/2016, 2:32 PM

## 2016-08-05 NOTE — Care Management Note (Signed)
Case Management Note  Patient Details  Name: Stacy HolsterLinda Stanley MRN: [id number] Date of Birth: 02/02/1941  Subjective/Objective: 75 yr old female s/p right total knee arthroplasty.                   Action/Plan: Patient was preoperatively setup with [facility], patient is not doing well with therapy and will need shortterm rehab. Social worker has been notified.  Expected Discharge Date:    08/07/16            Expected Discharge Plan:  To be determined  In-House Referral:  NA  Discharge planning Services  CM Consult  Post Acute Care Choice:  Home Health Choice offered to:  Patient  DME Arranged:  3-N-1, CPM, Walker rolling DME Agency:  [employer]  HH Arranged:  PT HH Agency:  [facility]  Status of Service:  In process, will continue to follow  If discussed at Long Length of Stay Meetings, dates discussed:    Additional Comments:  Durenda GuthrieBrady, Nuvia Hileman Naomi, RN 08/05/2016, 11:42 AM

## 2016-08-05 NOTE — Progress Notes (Signed)
Physical Therapy Treatment Patient Details Name: Stacy HolsterLinda Stanley MRN: [id number] DOB: 04/13/1941 Today's Date: 08/05/2016    History of Present Illness Patient is a 75 y/o female s/p R TKA; PMH positive for GERD, HTN, peripheral neuropathy, C1-2&T2-8 fusion, RLS, HLD, Anemia, and anxiety.    PT Comments    Pt requiring incr assist today; remains groggy but is easily arousable for PT; recommend +2 next session for safety;  recommend SNF post acute, do not feel pt will improve enough in the next 1-2 days  to safely return home with husband's support    Follow Up Recommendations  SNF;Supervision/Assistance - 24 hour     Equipment Recommendations  None recommended by PT    Recommendations for Other Services       Precautions / Restrictions Precautions Precautions: Fall;Knee Required Braces or Orthoses: Knee Immobilizer - Right Restrictions RLE Weight Bearing: Weight bearing as tolerated    Mobility  Bed Mobility Overal bed mobility: Needs Assistance Bed Mobility: Supine to Sit     Supine to sit: Mod assist     General bed mobility comments: heavy mod assist to support trunk, assist wtih bil LEs off bed, bed pad used to assist scooting with heavy mod assist and support to prevent posterior LOB; incr time and repetitious cues needed  for technique &  pt self assist  Transfers Overall transfer level: Needs assistance Equipment used: Rolling walker (2 wheeled) Transfers: Sit to/from Stand Sit to Stand: Mod assist;Max assist Stand pivot transfers: Max assist;Mod assist       General transfer comment: pt requiring incr time and multi-modal cues for hand placement, wt shift, safety; assist for anterior-superior translation,  with RW position, and to prevent posterior LOB  throughout stand pivot, chair to pt to prevent fall;   Ambulation/Gait             General Gait Details: unable   Stairs            Wheelchair Mobility    Modified Rankin (Stroke Patients  Only)       Balance   Sitting-balance support: Feet supported;Bilateral upper extremity supported Sitting balance-Leahy Scale: Poor     Standing balance support: Bilateral upper extremity supported Standing balance-Leahy Scale: Zero Standing balance comment: posterior LOB in sitting and standing                    Cognition Arousal/Alertness:  (sleepy d/t meds; easily arousable but closes eyes frequently) Behavior During Therapy: WFL for tasks assessed/performed Overall Cognitive Status: Impaired/Different from baseline Area of Impairment: Following commands;Problem solving       Following Commands: Follows one step commands inconsistently     Problem Solving: Slow processing;Decreased initiation;Difficulty sequencing;Requires verbal cues;Requires tactile cues General Comments: pt husband reports she is often groggy in the am at her baseline d/t  poor sleep    Exercises Total Joint Exercises Ankle Circles/Pumps: AROM;Both;10 reps;Supine Quad Sets: AAROM;Right;5 reps;Limitations;Supine Quad Sets Limitations: improved ablility to coordinate and recruit quads but limited by pain Heel Slides: Limitations Heel Slides Limitations: unable d/t pain/guarding Straight Leg Raises: AAROM;Right;10 reps    General Comments        Pertinent Vitals/Pain Pain Assessment: Faces Faces Pain Scale: Hurts even more Pain Location: R knee with any movement Pain Descriptors / Indicators: Grimacing;Guarding;Moaning Pain Intervention(s): Limited activity within patient's tolerance;Monitored during session;Repositioned    Home Living  Prior Function            PT Goals (current goals can now be found in the care plan section) Acute Rehab PT Goals Patient Stated Goal: To return to independent PT Goal Formulation: With patient Time For Goal Achievement: 08/11/16 Potential to Achieve Goals: Good Progress towards PT goals: Not progressing toward goals  - comment (requiring incr assist this session)    Frequency    7X/week      PT Plan Discharge plan needs to be updated    Co-evaluation             End of Session Equipment Utilized During Treatment: Gait belt;Right knee immobilizer Activity Tolerance: Patient limited by fatigue;Patient limited by pain;Other (comment) (sleepy)       Time: [id number] PT Time Calculation (min) (ACUTE ONLY): 23 min  Charges:  $Therapeutic Exercise: 8-22 mins $Therapeutic Activity: 8-22 mins                    G Codes:      Stacy Stanley 08/05/2016, 10:34 AM

## 2016-08-05 NOTE — Progress Notes (Signed)
   08/05/16 1400  PT Visit Information  Last PT Received On 08/05/16  Assistance Needed +2  PT/OT/SLP Co-Evaluation/Treatment Yes  Reason for Co-Treatment For patient/therapist safety  PT goals addressed during session Mobility/safety with mobility;Proper use of DME  History of Present Illness Patient is a 75 y/o female s/p R TKA; PMH positive for GERD, HTN, peripheral neuropathy, C1-2&T2-8 fusion, RLS, HLD, Anemia, and anxiety.  Precautions  Precautions Fall;Knee  Required Braces or Orthoses Knee Immobilizer - Right  Restrictions  RLE Weight Bearing WBAT  Pain Assessment  Pain Assessment Faces  Faces Pain Scale 4  Pain Location R knee  Pain Descriptors / Indicators Grimacing;Guarding  Pain Intervention(s) Limited activity within patient's tolerance;Monitored during session;Premedicated before session;Repositioned  Cognition  Arousal/Alertness Lethargic;Suspect due to medications  Behavior During Therapy [facility] for tasks assessed/performed  Overall Cognitive Status Impaired/Different from baseline  Area of Impairment Following commands;Problem solving  Following Commands Follows one step commands inconsistently  Problem Solving Slow processing;Decreased initiation;Difficulty sequencing;Requires verbal cues;Requires tactile cues  Bed Mobility  Overal bed mobility Needs Assistance  Bed Mobility Sit to Supine  Sit to supine +2 for physical assistance;+2 for safety/equipment;Max assist  General bed mobility comments assist with trunk and bil LEs   Transfers  Overall transfer level Needs assistance  Equipment used Rolling walker (2 wheeled)  Transfers Sit to/from Stand;Stand Pivot Transfers  Sit to Stand Max assist;+2 physical assistance;+2 safety/equipment  Stand pivot transfers Max assist;+2 physical assistance;+2 safety/equipment  General transfer comment pt requiring incr time and multi-modal cues for hand placement, wt shift, safety; assist for anterior-superior translation,  with  RW position, and to prevent posterior LOB  throughout stand pivot, chair to pt to prevent fall;   Balance  Sitting balance-Leahy Scale Fair  Standing balance-Leahy Scale Zero  PT - End of Session  Equipment Utilized During Treatment Gait belt;Right knee immobilizer  Activity Tolerance Patient limited by lethargy;Patient limited by fatigue;Patient limited by pain  Patient left in bed;with call bell/phone within reach;with bed alarm set  PT - Assessment/Plan  PT Plan Current plan remains appropriate  PT Frequency (ACUTE ONLY) 7X/week  Follow Up Recommendations SNF;Supervision/Assistance - 24 hour  PT equipment None recommended by PT  PT Goal Progression  Progress towards PT goals Progressing toward goals  Acute Rehab PT Goals  PT Goal Formulation With patient  Time For Goal Achievement 08/11/16  Potential to Achieve Goals Good  PT Time Calculation  PT Start Time (ACUTE ONLY) 1217  PT Stop Time (ACUTE ONLY) 1242  PT Time Calculation (min) (ACUTE ONLY) 25 min  PT General Charges  $$ ACUTE PT VISIT 1 Procedure  PT Treatments  $Therapeutic Activity 8-22 mins

## 2016-08-05 NOTE — Progress Notes (Signed)
Orthopedic Tech Progress Note Patient Details:  Sharee HolsterLinda Aracena 08/01/1941 [id number] Put on cpm at 1805 Patient ID: Sharee HolsterLinda Lieske, female   DOB: 04/02/1941, 75 y.o.   MRN: [id number]   [name], Detta Mellin Craig 08/05/2016, 6:05 PM

## 2016-08-05 NOTE — Progress Notes (Signed)
Subjective: 1 Day Post-Op Procedure(s) (LRB): TOTAL KNEE ARTHROPLASTY (Right)  Activity level:  wbat Diet tolerance:  ok Voiding:  Foley out this morning Patient reports pain as moderate.    Objective: Vital signs in last 24 hours: Temp:  [97.2 F (36.2 C)-98.2 F (36.8 C)] 98 F (36.7 C) (11/29 0408) Pulse Rate:  [63-79] 72 (11/29 0408) Resp:  [11-18] 16 (11/29 0408) BP: (95-159)/(45-68) 124/68 (11/29 0408) SpO2:  [93 %-98 %] 98 % (11/29 0408)  Labs: No results for input(s): HGB in the last 72 hours. No results for input(s): WBC, RBC, HCT, PLT in the last 72 hours. No results for input(s): NA, K, CL, CO2, BUN, CREATININE, GLUCOSE, CALCIUM in the last 72 hours. No results for input(s): LABPT, INR in the last 72 hours.  Physical Exam:  Neurologically intact ABD soft Neurovascular intact Sensation intact distally Intact pulses distally Dorsiflexion/Plantar flexion intact Incision: dressing C/D/I and no drainage No cellulitis present Compartment soft  Assessment/Plan:  1 Day Post-Op Procedure(s) (LRB): TOTAL KNEE ARTHROPLASTY (Right) Advance diet Up with therapy Plan for discharge tomorrow if doing well and cleared by PT. We will decide home vs SNF depending on how she does. May need to wait until Friday depending on how she does. Continue on ASA 325mg  BID x 2 weeks post op for DVT prevention. Follow up in office 2 weeks post op. I will change dressing to Aquacel tomorrow.  Chanel Mcadams, Ginger OrganNDREW PAUL 08/05/2016, 7:58 AM

## 2016-08-06 MED ORDER — BENZONATATE 100 MG PO CAPS
100.0000 mg | ORAL_CAPSULE | Freq: Two times a day (BID) | ORAL | Status: DC | PRN
  Administered 2016-08-06: 100 mg via ORAL
  Filled 2016-08-06: qty 1

## 2016-08-06 MED ORDER — TAPENTADOL HCL 50 MG PO TABS
50.0000 mg | ORAL_TABLET | Freq: Two times a day (BID) | ORAL | Status: DC
  Administered 2016-08-06: 50 mg via ORAL
  Filled 2016-08-06 (×2): qty 1

## 2016-08-06 MED ORDER — LACTATED RINGERS IV SOLN
INTRAVENOUS | Status: DC
  Administered 2016-08-06 – 2016-08-07 (×2): via INTRAVENOUS

## 2016-08-06 MED ORDER — TRAMADOL HCL 50 MG PO TABS
50.0000 mg | ORAL_TABLET | Freq: Four times a day (QID) | ORAL | Status: DC
  Administered 2016-08-06 – 2016-08-10 (×16): 50 mg via ORAL
  Filled 2016-08-06 (×16): qty 1

## 2016-08-06 NOTE — Progress Notes (Signed)
Rept received from [address] RN. Pt resting SF in bed with no c/o distress. Husband at bedside. Husband verbalizes concern regarding pt's medication regimen and maybe pt's home medication regimen is incorrect. Pt is sleepy but very arousable. Pt intermittently confused as per baseline during this shift as per rept from off going RN. Dr. Lanora Manisalldorg on way to see pt. Will continue to monitor.

## 2016-08-06 NOTE — Clinical Social Work Note (Signed)
Per RNCM conversation with pt, Pt is requesting [name]. CSW will facilitate placement at this time, assessment to follow.  [address]Stacy Stanley, ConnecticutLCSWA [phone]

## 2016-08-06 NOTE — Progress Notes (Signed)
Subjective: 2 Days Post-Op Procedure(s) (LRB): TOTAL KNEE ARTHROPLASTY (Right)   Patient very sleepy and husband thinks that the home dose of nucentya is too much and that they are taking it differently.  Activity level:  WBAT Diet tolerance:  ok Voiding:  ok Patient reports pain as moderate.    Objective: Vital signs in last 24 hours: Temp:  [97.9 F (36.6 C)-98.6 F (37 C)] 98.6 F (37 C) (11/30 1500) Pulse Rate:  [79-80] 80 (11/30 1500) Resp:  [16-18] 16 (11/30 1500) BP: (142-173)/(55-61) 143/61 (11/30 1500) SpO2:  [95 %] 95 % (11/30 1500)  Labs: No results for input(s): HGB in the last 72 hours. No results for input(s): WBC, RBC, HCT, PLT in the last 72 hours. No results for input(s): NA, K, CL, CO2, BUN, CREATININE, GLUCOSE, CALCIUM in the last 72 hours. No results for input(s): LABPT, INR in the last 72 hours.  Physical Exam:  Neurologically intact ABD soft Neurovascular intact Sensation intact distally Intact pulses distally Dorsiflexion/Plantar flexion intact Incision: dressing C/D/I and no drainage No cellulitis present Compartment soft  Assessment/Plan:  2 Days Post-Op Procedure(s) (LRB): TOTAL KNEE ARTHROPLASTY (Right) Advance diet Up with therapy Discharge to SNF probably Saturday if walking and moving better. I have reduced the nucyenta dose. I have restarted her IV fluids to try to perk her up and flush her system a little from the pain meds.  I changed the dressing to aquacel.  She will continue on her ASA 325mg  BID x 2 weeks post op.  Stacy Stanley, Stacy Stanley 08/06/2016, 4:17 PM

## 2016-08-06 NOTE — Progress Notes (Signed)
Dr. Jerl Santosalldorf and his PA [name] in to see pt and examine her. Husband at bedside to discuss pt's current medication regimen in relation to her home regimen. Pt remains lethargic but arousable and intermittently confused. MD/PA changed pt's dressing from ace to an Aquacel. Order's received. Will continue to monitor.

## 2016-08-06 NOTE — Progress Notes (Signed)
Physical Therapy Treatment Patient Details Name: Stacy HolsterLinda Meunier MRN: [id number] DOB: 04/13/1941 Today's Date: 08/06/2016    History of Present Illness Patient is a 75 y/o female s/p R TKA; PMH positive for GERD, HTN, peripheral neuropathy, C1-2&T2-8 fusion, RLS, HLD, Anemia, and anxiety.    PT Comments    Patient continues to require +2 assist for mobility however is making very gradual progress with tolerance of short distance gait this session. Continue to progress as tolerated with anticipated d/c to SNF for further skilled PT services.    Follow Up Recommendations  SNF;Supervision/Assistance - 24 hour     Equipment Recommendations  None recommended by PT    Recommendations for Other Services       Precautions / Restrictions Precautions Precautions: Fall;Knee Required Braces or Orthoses: Knee Immobilizer - Right Restrictions Weight Bearing Restrictions: Yes RLE Weight Bearing: Weight bearing as tolerated    Mobility  Bed Mobility Overal bed mobility: Needs Assistance Bed Mobility: Supine to Sit     Supine to sit: Mod assist;Max assist;+2 for physical assistance;HOB elevated     General bed mobility comments: mod A for bilat LE mobility and elevating trunk into sitting and max A +2 to maintain sitting balance and scoot hips to EOB using bed pad   Transfers Overall transfer level: Needs assistance Equipment used: Rolling walker (2 wheeled) Transfers: Sit to/from Stand Sit to Stand: +2 physical assistance;Mod assist         General transfer comment: from EOB and BSC; assist to power up into standing and maintain balance upon stand as pt has tnedency for posterior lean; max multimodal cues for hand placement and posture  Ambulation/Gait Ambulation/Gait assistance: Mod assist;+2 safety/equipment Ambulation Distance (Feet):  (483ft, 592ft) Assistive device: Rolling walker (2 wheeled) Gait Pattern/deviations: Step-to pattern;Decreased stance time - right;Decreased step  length - left;Decreased step length - right;Decreased weight shift to right;Shuffle;Antalgic;Trunk flexed     General Gait Details: max multimodal cues for sequencing, safe use of AD, posture, and assistance to advance R LE forward    Stairs            Wheelchair Mobility    Modified Rankin (Stroke Patients Only)       Balance     Sitting balance-Leahy Scale: Fair       Standing balance-Leahy Scale: Poor                      Cognition Arousal/Alertness: Awake/alert Behavior During Therapy: WFL for tasks assessed/performed Overall Cognitive Status: Impaired/Different from baseline Area of Impairment: Following commands;Problem solving       Following Commands: Follows one step commands with increased time     Problem Solving: Slow processing;Decreased initiation;Requires verbal cues General Comments: pt perseverated commands and slow to process/initiate movements    Exercises      General Comments General comments (skin integrity, edema, etc.): husband present      Pertinent Vitals/Pain Pain Assessment: Faces Faces Pain Scale: Hurts even more (with mobility) Pain Location: R knee Pain Descriptors / Indicators: Guarding;Grimacing;Sore    Home Living                      Prior Function            PT Goals (current goals can now be found in the care plan section) Acute Rehab PT Goals Patient Stated Goal: be able to walk Progress towards PT goals: Progressing toward goals    Frequency  7X/week      PT Plan Current plan remains appropriate    Co-evaluation             End of Session Equipment Utilized During Treatment: Gait belt Activity Tolerance: Patient limited by fatigue Patient left: with call bell/phone within reach;in chair;with family/visitor present;with chair alarm set     Time: [id number] PT Time Calculation (min) (ACUTE ONLY): 32 min  Charges:  $Gait Training: 8-22 mins $Therapeutic Activity: 8-22  mins                    G Codes:      Derek MoundKellyn R Barrie Wale Redell Nazir, PTA Pager: [phone]   08/06/2016, 12:27 PM

## 2016-08-06 NOTE — Progress Notes (Signed)
Orthopedic Tech Progress Note Patient Details:  Stacy Stanley 05/04/1941 [id number]  Patient ID: Stacy HolsterLinda Stanley, female   DOB: 06/12/1941, 75 y.o.   MRN: [id number] Pt agreed to go in cpm until I removed the foam block from underneath her foot. Then she refused and I put the block back under her heel.  Trinna PostMartinez, Kinzlie Harney J 08/06/2016, 5:28 AM

## 2016-08-06 NOTE — Progress Notes (Signed)
Physical Therapy Treatment Patient Details Name: Stacy HolsterLinda Stanley MRN: [id number] DOB: 02/16/1941 Today's Date: 08/06/2016    History of Present Illness Patient is a 75 y/o female s/p R TKA; PMH positive for GERD, HTN, peripheral neuropathy, C1-2&T2-8 fusion, RLS, HLD, Anemia, and anxiety.    PT Comments     Nursing and nursing assistant are having difficulty transferring pt via STEADY x2. Assisted pt with stand pivot transfer with RN assistance from recliner to bed with below assistance and required max a x2 to properly position pt in bed following transfer. SNF placement continues to be the best option for the pt.    Follow Up Recommendations  SNF;Supervision/Assistance - 24 hour     Equipment Recommendations  None recommended by PT    Recommendations for Other Services       Precautions / Restrictions Precautions Precautions: Fall;Knee Required Braces or Orthoses: Knee Immobilizer - Right Restrictions Weight Bearing Restrictions: Yes RLE Weight Bearing: Weight bearing as tolerated    Mobility  Bed Mobility Overal bed mobility: Needs Assistance Bed Mobility: Sit to Supine     Supine to sit: Mod assist;Max assist;+2 for physical assistance;HOB elevated Sit to supine: Max assist;+2 for physical assistance   General bed mobility comments: Max A x2 to get pt back to bed  Transfers Overall transfer level: Needs assistance Equipment used: None Transfers: Stand Pivot Transfers Sit to Stand: +2 physical assistance;Mod assist Stand pivot transfers: Max assist;+2 physical assistance;+2 safety/equipment       General transfer comment: from recliner to bed with max A and cues for moving LE's duing transfer  Ambulation/Gait Ambulation/Gait assistance: Mod assist;+2 safety/equipment Ambulation Distance (Feet):  (243ft, 202ft) Assistive device: Rolling walker (2 wheeled) Gait Pattern/deviations: Step-to pattern;Decreased stance time - right;Decreased step length - left;Decreased  step length - right;Decreased weight shift to right;Shuffle;Antalgic;Trunk flexed     General Gait Details: max multimodal cues for sequencing, safe use of AD, posture, and assistance to advance R LE forward    Stairs            Wheelchair Mobility    Modified Rankin (Stroke Patients Only)       Balance     Sitting balance-Leahy Scale: Fair       Standing balance-Leahy Scale: Poor                      Cognition Arousal/Alertness: Awake/alert Behavior During Therapy: WFL for tasks assessed/performed Overall Cognitive Status: Impaired/Different from baseline Area of Impairment: Following commands;Problem solving       Following Commands: Follows one step commands with increased time     Problem Solving: Slow processing;Decreased initiation;Requires verbal cues General Comments: pt perseverated commands and slow to process/initiate movements    Exercises      General Comments General comments (skin integrity, edema, etc.): husband present      Pertinent Vitals/Pain Pain Assessment: Faces Faces Pain Scale: Hurts even more (with mobility) Pain Location: R knee Pain Descriptors / Indicators: Guarding;Grimacing;Sore    Home Living                      Prior Function            PT Goals (current goals can now be found in the care plan section) Acute Rehab PT Goals Patient Stated Goal: be able to walk Progress towards PT goals: Progressing toward goals    Frequency    7X/week      PT Plan Current plan  remains appropriate    Co-evaluation             End of Session Equipment Utilized During Treatment: Gait belt Activity Tolerance: Patient limited by fatigue Patient left: in bed;with call bell/phone within reach;with nursing/sitter in room     Time: [id number] PT Time Calculation (min) (ACUTE ONLY): 12 min  Charges:  $Gait Training: 8-22 mins $Therapeutic Activity: 8-22 mins                    G Codes:      Colin BroachSabra  M. Delorice Bannister PT, DPT  [phone]  08/06/2016, 3:16 PM

## 2016-08-07 MED ORDER — HYDROCODONE-ACETAMINOPHEN 5-325 MG PO TABS
1.0000 | ORAL_TABLET | Freq: Four times a day (QID) | ORAL | Status: DC | PRN
  Administered 2016-08-07: 1 via ORAL
  Administered 2016-08-08 – 2016-08-09 (×5): 2 via ORAL
  Administered 2016-08-10: 1 via ORAL
  Filled 2016-08-07: qty 2
  Filled 2016-08-07: qty 1
  Filled 2016-08-07 (×2): qty 2
  Filled 2016-08-07: qty 1
  Filled 2016-08-07 (×2): qty 2

## 2016-08-07 MED ORDER — KETOTIFEN FUMARATE 0.025 % OP SOLN
1.0000 [drp] | Freq: Two times a day (BID) | OPHTHALMIC | Status: DC
  Administered 2016-08-07 – 2016-08-10 (×7): 1 [drp] via OPHTHALMIC
  Filled 2016-08-07: qty 5

## 2016-08-07 NOTE — Progress Notes (Signed)
Subjective: 3 Days Post-Op Procedure(s) (LRB): TOTAL KNEE ARTHROPLASTY (Right)   Patient still very sleepy this morning. Husband states that the nucyenta is a new medicine and she has been very sedated while taking it and is wondering if we can stop it. They are also hesitant about going to SNF tomorrow as she has not done much with PT and has been very sleepy on her narcotics.  Activity level:  wbat Diet tolerance:  ok Voiding:  ok Patient reports pain as mild and moderate.    Objective: Vital signs in last 24 hours: Temp:  [98.1 F (36.7 C)-98.6 F (37 C)] 98.1 F (36.7 C) (12/01 0658) Pulse Rate:  [74-88] 74 (12/01 0658) Resp:  [16-18] 16 (12/01 0658) BP: (142-171)/(61-86) 171/63 (12/01 0658) SpO2:  [95 %-98 %] 95 % (12/01 0658)  Labs: No results for input(s): HGB in the last 72 hours. No results for input(s): WBC, RBC, HCT, PLT in the last 72 hours. No results for input(s): NA, K, CL, CO2, BUN, CREATININE, GLUCOSE, CALCIUM in the last 72 hours. No results for input(s): LABPT, INR in the last 72 hours.  Physical Exam:  Neurologically intact ABD soft Neurovascular intact Sensation intact distally Intact pulses distally Dorsiflexion/Plantar flexion intact Incision: dressing C/D/I and no drainage No cellulitis present Compartment soft  Assessment/Plan:  3 Days Post-Op Procedure(s) (LRB): TOTAL KNEE ARTHROPLASTY (Right) Advance diet Up with therapy Discharge to SNF probably Monday as she has not done any meaningful ambulation and has been over sedated with her pain medications.  Continue on ASA 325mg  BID x 2 weeks post op. I have spoke to pharmacy and stopped nucyenta and we will try just the tramadol and norco PRN.  Follow up in office 2 weeks post op.  Benen Weida, Ginger OrganNDREW PAUL 08/07/2016, 7:57 AM

## 2016-08-07 NOTE — Progress Notes (Signed)
Orthopedic Tech Progress Note Patient Details:  Stacy HolsterLinda Gehret 04/16/1941 [id number]  CPM Right Knee CPM Right Knee: On Right Knee Flexion (Degrees): 40 Right Knee Extension (Degrees): 0 Additional Comments: pt requested to stay at 0-40 on cpm.   Alvina ChouWilliams, Aubree Doody C 08/07/2016, 1:40 PM

## 2016-08-07 NOTE — Plan of Care (Signed)
Problem: Activity: Goal: Will remain free from falls Outcome: Progressing No fall or injury noted this shift, safety precautions and fall preventions maintained  Problem: Physical Regulation: Goal: Postoperative complications will be avoided or minimized Outcome: Progressing No complications noted  Problem: Pain Management: Goal: Pain level will decrease with appropriate interventions Outcome: Progressing Pain well managed, denies pain at this time

## 2016-08-07 NOTE — Clinical Social Work Placement (Addendum)
   CLINICAL SOCIAL WORK PLACEMENT  NOTE  Date:  08/07/2016  Patient Details  Name: Stacy Stanley MRN: [id number] Date of Birth: 06/07/1941  Clinical Social Work is seeking post-discharge placement for this patient at the Skilled  Nursing Facility level of care (*CSW will initial, date and re-position this form in  chart as items are completed):      Patient/family provided with [facility] Clinical Social Work Department's list of facilities offering this level of care within the geographic area requested by the patient (or if unable, by the patient's family).      Patient/family informed of their freedom to choose among providers that offer the needed level of care, that participate in Medicare, Medicaid or managed care program needed by the patient, have an available bed and are willing to accept the patient.      Patient/family informed of [facility]'s ownership interest in [facility] and [facility], as well as of the fact that they are under no obligation to receive care at these facilities.  PASRR submitted to EDS on       PASRR number received on       Existing PASRR number confirmed on 08/07/16     FL2 transmitted to all facilities in geographic area requested by pt/family on 08/07/16     FL2 transmitted to all facilities within larger geographic area on       Patient informed that his/her managed care company has contracts with or will negotiate with certain facilities, including the following:            Patient/family informed of bed offers received.  [facility]     Physician recommends and patient chooses bed at      [name].  Patient to be transferred to facility by [facility]     Patient family notified on 08/10/16 of transfer.  Name of family member notified:  Terrial RhodesDonald Ocain (spouse)     PHYSICIAN       Additional Comment:    _______________________________________________ Volney AmericanBridget  A Mayton, LCSW 08/07/2016, 1:03 PM

## 2016-08-07 NOTE — Progress Notes (Signed)
Orthopedic Tech Progress Note Patient Details:  Stacy Stanley 06/26/1941 [id number] Put on cpm at 1820 Patient ID: Stacy Stanley, female   DOB: 03/21/1941, 75 y.o.   MRN: [id number]   [name], Tyson Parkison Craig 08/07/2016, 6:25 PM

## 2016-08-07 NOTE — Progress Notes (Signed)
Physical Therapy Treatment Patient Details Name: Stacy HolsterLinda Stanley MRN: [id number] DOB: 12/29/1940 Today's Date: 08/07/2016    History of Present Illness Patient is a 75 y/o female s/p R TKA; PMH positive for GERD, HTN, peripheral neuropathy, C1-2&T2-8 fusion, RLS, HLD, Anemia, and anxiety.    PT Comments    Patient is progressing gradually toward mobility goals. Tolerated gait distance of 7516ft this session and continues to require assistance for all mobility. Current plan remains appropriate.   Follow Up Recommendations  SNF;Supervision/Assistance - 24 hour     Equipment Recommendations  None recommended by PT    Recommendations for Other Services       Precautions / Restrictions Precautions Precautions: Fall;Knee Required Braces or Orthoses: Knee Immobilizer - Right Restrictions Weight Bearing Restrictions: Yes RLE Weight Bearing: Weight bearing as tolerated    Mobility  Bed Mobility Overal bed mobility: Needs Assistance Bed Mobility: Rolling;Sidelying to Sit Rolling: Mod assist Sidelying to sit: Mod assist;+2 for physical assistance;HOB elevated       General bed mobility comments: assist to bring R LE to EOB, elevate trunk into sitting and scoot hips to EOB with +2 assist and use of rail   Transfers Overall transfer level: Needs assistance Equipment used: Rolling walker (2 wheeled) Transfers: Sit to/from Stand Sit to Stand: +2 physical assistance;Mod assist;Max assist         General transfer comment: mod A +2 from EOB adn [facility] to power up into standing with cues for hand placement and technique; pt continues to demo posterior lean upon standing  Ambulation/Gait Ambulation/Gait assistance: +2 safety/equipment;Mod assist (chair follow for safety) Ambulation Distance (Feet): 16 Feet Assistive device: Rolling walker (2 wheeled) Gait Pattern/deviations: Step-to pattern;Decreased stance time - right;Decreased step length - left;Decreased weight shift to  right;Antalgic;Trunk flexed Gait velocity: slow   General Gait Details: max cues for sequencing, safe use of AD, and posture; LOB X2 with posterior lean; assistance required for weight shift, balance, and RW management   Stairs            Wheelchair Mobility    Modified Rankin (Stroke Patients Only)       Balance                                    Cognition Arousal/Alertness: Awake/alert Behavior During Therapy: WFL for tasks assessed/performed Overall Cognitive Status: Within Functional Limits for tasks assessed                      Exercises      General Comments General comments (skin integrity, edema, etc.): husband      Pertinent Vitals/Pain Pain Assessment: Faces Faces Pain Scale: Hurts little more Pain Location: R knee Pain Descriptors / Indicators: Aching Pain Intervention(s): Limited activity within patient's tolerance;Monitored during session;Premedicated before session;Repositioned    Home Living                      Prior Function            PT Goals (current goals can now be found in the care plan section) Acute Rehab PT Goals Patient Stated Goal: get better Progress towards PT goals: Progressing toward goals    Frequency    7X/week      PT Plan Current plan remains appropriate    Co-evaluation  End of Session Equipment Utilized During Treatment: Gait belt Activity Tolerance: Patient tolerated treatment well Patient left: with call bell/phone within reach;in chair;with family/visitor present     Time: 0923-1002 PT Time Calculation (min) (ACUTE ONLY): 39 min  Charges:  $Gait Training: 23-37 mins $Therapeutic Activity: 8-22 mins                    G Codes:      Derek MoundKellyn R Yanette Tripoli Desare Duddy, PTA Pager: [phone]   08/07/2016, 11:17 AM

## 2016-08-07 NOTE — Care Management Important Message (Signed)
Important Message  Patient Details  Name: Stacy HolsterLinda Stanley MRN: [id number] Date of Birth: 03/20/1941   Medicare Important Message Given:  Yes    Kyla BalzarineShealy, [name] 08/07/2016, 1:08 PM

## 2016-08-07 NOTE — Clinical Social Work Note (Signed)
Clinical Social Work Assessment  Patient Details  Name: Stacy Stanley MRN: [id number] Date of Birth: 11/08/1940  Date of referral:  08/07/16               Reason for consult:  Facility Placement                Permission sought to share information with:  Family Supports Permission granted to share information::  Yes, Verbal Permission Granted  Name::     Dorinda HillDonald  Agency::     Relationship::  Spouse  Contact Information:  [phone]  Housing/Transportation Living arrangements for the past 2 months:  Single Family Home Source of Information:  Patient Patient Interpreter Needed:  None Criminal Activity/Legal Involvement Pertinent to Current Situation/Hospitalization:  No - Comment as needed Significant Relationships:  Spouse Lives with:  Spouse Do you feel safe going back to the place where you live?  Yes Need for family participation in patient care:  Yes (Comment)  Care giving concerns:  Pt's spouse at bedside. Pt's spouse has no concerns at this time.   Social Worker assessment / plan:  CSW spoke with pt and pt's spouse at bedside. Pt is agreeable to SNF at this time. Pt lives at home with husband. Pt's husband is able to help in pt care after return from SNF. [facility]. CSW will continue to facilitate SNF placement.   Employment status:  Retired Health and safety inspectornsurance information:  Medicare PT Recommendations:  Skilled Nursing Facility Information / Referral to community resources:  Skilled Nursing Facility  Patient/Family's Response to care:  Pt verbalized understanding of CSW role and appreciation of support.  Patient/Family's Understanding of and Emotional Response to Diagnosis, Current Treatment, and Prognosis:  Pt realistic and understanding of physical limitations. Pt is agreeable to SNF placement. Pt denies any questions or concerns regarding treatment plan at this time.   Emotional Assessment Appearance:  Appears stated  age Attitude/Demeanor/Rapport:   (Patient was appropriate.) Affect (typically observed):  Accepting, Appropriate, Pleasant Orientation:  Oriented to Place, Oriented to Self, Oriented to  Time, Oriented to Situation Alcohol / Substance use:  Not Applicable Psych involvement (Current and /or in the community):  No (Comment)  Discharge Needs  Concerns to be addressed:  No discharge needs identified Readmission within the last 30 days:  No Current discharge risk:  Dependent with Mobility Barriers to Discharge:  Continued Medical Work up   Safeway IncBridget A Mayton, LCSW 08/07/2016, 12:10 PM

## 2016-08-08 NOTE — Progress Notes (Signed)
Orthopedic Tech Progress Note Patient Details:  Stacy Stanley 01/02/1941 [id number]  Patient ID: Stacy HolsterLinda Stanley, female   DOB: 07/01/1941, 75 y.o.   MRN: [id number] Applied cpm 0-45 at [address]  Trinna PostMartinez, Kenndra Morris J 08/08/2016, 8:02 PM

## 2016-08-08 NOTE — Clinical Social Work Note (Signed)
CSW spoke with [facility] Per Margretta SidleHeather [facility] does not do weekend admissions. Herbert SetaHeather is going to review the pt's referral on the hub and states as long as the facility can meet the pt's needs they will take the pt on Monday.   [address] Alderwood DriveBridget Mayton, ConnecticutLCSWA [phone]

## 2016-08-08 NOTE — Plan of Care (Signed)
Problem: Activity: Goal: Risk for activity intolerance will decrease Outcome: Progressing Turning in the bed from side to side with moderate assistance  Problem: Bowel/Gastric: Goal: Will not experience complications related to bowel motility Outcome: Progressing No bowel or gastric issues reported  Problem: Physical Regulation: Goal: Postoperative complications will be avoided or minimized Outcome: Progressing No complications noted

## 2016-08-08 NOTE — Progress Notes (Signed)
Subjective: 4 Days Post-Op Procedure(s) (LRB): TOTAL KNEE ARTHROPLASTY (Right)   Patient looks excellent this morning. She is alert and not sedated, great conversation, minimal pain, CPM in place, good progress in PT. Feels much better off Nucynta.  Activity level:  wbat Diet tolerance:  ok Voiding:  ok Patient reports pain as mild and moderate.    Objective: BP (!) 144/59 (BP Location: Right Arm)   Pulse 70   Temp 98.6 F (37 C) (Oral)   Resp 16   Wt 93.4 kg (206 lb)   SpO2 96%   BMI 40.23 kg/m    Physical Exam:  Neurologically intact ABD soft Neurovascular intact Sensation intact distally Intact pulses distally Dorsiflexion/Plantar flexion intact Incision: dressing C/D/I and no drainage No cellulitis present Compartment soft CPM in place  Assessment/Plan:  4 Days Post-Op Procedure(s) (LRB): TOTAL KNEE ARTHROPLASTY (Right) Advance diet Up with therapy Discharge to SNF when bed available, looks excellent   Continue on ASA 325mg  BID x 2 weeks post op. IContinue tramadol and norco PRN.  Follow up in office 2 weeks post op.

## 2016-08-08 NOTE — Progress Notes (Signed)
Physical Therapy Treatment Patient Details Name: Stacy HolsterLinda Alberg MRN: [id number] DOB: 03/13/1941 Today's Date: 08/08/2016    History of Present Illness Patient is a 75 y/o female s/p R TKA; PMH positive for GERD, HTN, peripheral neuropathy, C1-2&T2-8 fusion, RLS, HLD, Anemia, and anxiety.    PT Comments    Patient progressing with mobility.  Cont's to require assist for all mobility but completed with +1 assist today compared to +2 previous PT session.  Cont with current POC and discharge recommendation of SNF at discharge to maximize functional mobility prior to d/c home.     Follow Up Recommendations  SNF;Supervision/Assistance - 24 hour     Equipment Recommendations  None recommended by PT    Recommendations for Other Services       Precautions / Restrictions Precautions Precautions: Fall;Knee Required Braces or Orthoses: Knee Immobilizer - Right Restrictions RLE Weight Bearing: Weight bearing as tolerated    Mobility  Bed Mobility Overal bed mobility: Needs Assistance Bed Mobility: Supine to Sit     Supine to sit: Mod assist;HOB elevated     General bed mobility comments: cues for technique.  (A) for LE management & to bring shoulders/trunk to sitting upright.  use of draw pad to pivot hips around to EOB.   Transfers Overall transfer level: Needs assistance Equipment used: Rolling walker (2 wheeled) Transfers: Sit to/from Stand Sit to Stand: Mod assist         General transfer comment: cues for hand placement, scooting hips forwards.  (A) to power up to standing and shift weight atneriorly over BOS.    Ambulation/Gait Ambulation/Gait assistance: Min assist Ambulation Distance (Feet): 15 Feet Assistive device: Rolling walker (2 wheeled) Gait Pattern/deviations: Step-to pattern;Decreased weight shift to right;Decreased step length - left Gait velocity: slow   General Gait Details: cues for sequencing, RW advancement, and weight shifting to RLE to allow  sufficient step with LLE.  Improved step quality as distance increased.     Stairs            Wheelchair Mobility    Modified Rankin (Stroke Patients Only)       Balance                                    Cognition Arousal/Alertness: Awake/alert Behavior During Therapy: WFL for tasks assessed/performed Overall Cognitive Status: Within Functional Limits for tasks assessed Area of Impairment: Following commands;Problem solving                    Exercises Total Joint Exercises Ankle Circles/Pumps: AROM;Both;10 reps Quad Sets: AROM;Both;10 reps Straight Leg Raises: AAROM;Strengthening;Right;10 reps    General Comments        Pertinent Vitals/Pain Pain Score: 0-No pain Faces Pain Scale: Hurts little more Pain Location: rt knee Pain Descriptors / Indicators: Aching Pain Intervention(s): Limited activity within patient's tolerance;Monitored during session;Repositioned;Patient requesting pain meds-RN notified    Home Living                      Prior Function            PT Goals (current goals can now be found in the care plan section) Acute Rehab PT Goals Patient Stated Goal: to get better PT Goal Formulation: With patient Time For Goal Achievement: 08/11/16 Potential to Achieve Goals: Good Progress towards PT goals: Progressing toward goals    Frequency    7X/week  PT Plan Current plan remains appropriate    Co-evaluation             End of Session   Activity Tolerance: Patient tolerated treatment well Patient left: in chair;with call bell/phone within reach;with chair alarm set     Time: [id number] PT Time Calculation (min) (ACUTE ONLY): 39 min  Charges:  $Gait Training: 8-22 mins $Therapeutic Exercise: 8-22 mins $Therapeutic Activity: 8-22 mins                    G Codes:      Lara MulchCooper, Claris Guymon Lynn 08/08/2016, 11:00 AM   Verdell FaceKelly Jashayla Glatfelter, PTA [phone] 08/08/2016

## 2016-08-09 NOTE — Progress Notes (Signed)
    Subjective: 5 Days Post-OpProcedure(s) (LRB): TOTAL KNEE ARTHROPLASTY (Right)  Patient continues to look excellent this morning. She is alert and not sedated, great conversation, minimal pain, good progress in PT. Feels much better off Nucynta.   C/O pain and vision changes right eye starting 2 days PO  Activity level: wbat Diet tolerance: ok Voiding: ok Patient reports pain as mild and moderate.   Objective: BP 138/75 (BP Location: Left Arm)   Pulse 69   Temp 97.7 F (36.5 C) (Oral)   Resp 16   Wt 93.4 kg (206 lb)   SpO2 95%   BMI 40.23 kg/m    Physical Exam: Neurologically intact ABD soft Neurovascular intact Sensation intact distally Intact pulses distally Dorsiflexion/Plantar flexion intact Incision: dressing C/D/I and no drainage No cellulitis present Compartment soft CPM in place  Assessment/Plan:  5 Days Post-OpProcedure(s) (LRB): TOTAL KNEE ARTHROPLASTY (Right) Advance diet Up with therapy [facility], looks excellent   Continue on ASA 325mg  BID x 2 weeks post op. Continue tramadol and norco PRN.  Follow up in office 2 weeks post op F/U with private optometrist, pt going to call and make appt for early this week and will arrange for transportation from SNF

## 2016-08-09 NOTE — Progress Notes (Signed)
Orthopedic Tech Progress Note Patient Details:  Stacy HolsterLinda Stanley 10/28/1940 [id number]  Patient ID: Stacy HolsterLinda Stanley, female   DOB: 06/28/1941, 75 y.o.   MRN: [id number] Applied cpm 0-45 Trinna PostMartinez, Gedalya Jim J 08/09/2016, 6:28 AM

## 2016-08-09 NOTE — Progress Notes (Signed)
Pt reported vision changes in both eyes this morning. States vision is not as clear as it normally is, and is worse in the R eye. Spoke with Dorathy DaftKayla, PA about it and she would like pt to follow up with her optometrist as an outpatient. Pt and husband aware they need to make an appointment. Pt does not exhibit any other neurological symptoms.

## 2016-08-09 NOTE — Progress Notes (Signed)
Physical Therapy Treatment Patient Details Name: Stacy HolsterLinda Stanley MRN: [id number] DOB: 04/20/1941 Today's Date: 08/09/2016    History of Present Illness Patient is a 75 y/o female s/p R TKA; PMH positive for GERD, HTN, peripheral neuropathy, C1-2&T2-8 fusion, RLS, HLD, Anemia, and anxiety.    PT Comments    Pt making progress with goals but slowly.  Requires mod assist for bed mobility, min assist for transfers, and min guard for gt. Increased gt distance this visit.      Follow Up Recommendations  SNF;Supervision/Assistance - 24 hour     Equipment Recommendations  None recommended by PT    Recommendations for Other Services       Precautions / Restrictions Precautions Precautions: Fall;Knee Required Braces or Orthoses: Knee Immobilizer - Right Restrictions RLE Weight Bearing: Weight bearing as tolerated    Mobility  Bed Mobility Overal bed mobility: Needs Assistance Bed Mobility: Supine to Sit     Supine to sit: Mod assist;HOB elevated     General bed mobility comments: (A) for LE management & use of hand in hand grasp to come to sitting upright.   Transfers Overall transfer level: Needs assistance Equipment used: Rolling walker (2 wheeled) Transfers: Sit to/from Stand Sit to Stand: Min assist         General transfer comment: cues for hand placement.  (A) to boost hips into standing.   Ambulation/Gait Ambulation/Gait assistance: Min assist Ambulation Distance (Feet): 30 Feet (15' x 2) Assistive device: Rolling walker (2 wheeled) Gait Pattern/deviations: Step-to pattern;Decreased weight shift to right;Decreased stance time - right;Antalgic Gait velocity: slow   General Gait Details: cues for sequencing, RW management, increased wt shift to Rt during lt swing phase, quad activation during stance phase RLE.     Stairs            Wheelchair Mobility    Modified Rankin (Stroke Patients Only)       Balance                                     Cognition Arousal/Alertness: Awake/alert Behavior During Therapy: WFL for tasks assessed/performed Overall Cognitive Status: Within Functional Limits for tasks assessed                      Exercises Total Joint Exercises Ankle Circles/Pumps: AROM;Both;10 reps Quad Sets: AROM;Both;10 reps Hip ABduction/ADduction: AAROM;Right;10 reps Straight Leg Raises: AAROM;Strengthening;Right;10 reps    General Comments        Pertinent Vitals/Pain Faces Pain Scale: Hurts even more Pain Location: rt knee Pain Descriptors / Indicators: Aching Pain Intervention(s): Limited activity within patient's tolerance;Monitored during session;Repositioned    Home Living                      Prior Function            PT Goals (current goals can now be found in the care plan section) Acute Rehab PT Goals Patient Stated Goal: to walk PT Goal Formulation: With patient Time For Goal Achievement: 08/11/16 Potential to Achieve Goals: Good Progress towards PT goals: Progressing toward goals    Frequency    7X/week      PT Plan Current plan remains appropriate    Co-evaluation             End of Session   Activity Tolerance: Patient tolerated treatment well Patient left: in chair;with call bell/phone  within reach;with family/visitor present     Time: [id number] PT Time Calculation (min) (ACUTE ONLY): 45 min  Charges:  $Gait Training: 8-22 mins $Therapeutic Exercise: 8-22 mins $Therapeutic Activity: 8-22 mins                    G Codes:      Lara MulchCooper, Melanni Benway Lynn 08/09/2016, 12:25 PM   Verdell FaceKelly Marris Frontera, PTA [phone] 08/09/2016

## 2016-08-10 MED ORDER — METHOCARBAMOL 500 MG PO TABS: 500.0000 mg | ORAL_TABLET | Freq: Four times a day (QID) | ORAL | 0 refills | Status: DC | PRN

## 2016-08-10 MED ORDER — TRAMADOL HCL 50 MG PO TABS: 50.0000 mg | ORAL_TABLET | Freq: Four times a day (QID) | ORAL | 0 refills | Status: DC | PRN

## 2016-08-10 MED ORDER — ASPIRIN 325 MG PO TBEC: 325.0000 mg | DELAYED_RELEASE_TABLET | Freq: Two times a day (BID) | ORAL | 0 refills | Status: DC

## 2016-08-10 NOTE — Progress Notes (Signed)
Report called to Anibal HendersonAyesha, Rn, at facility that patient is being transported to at 1500. RN verbalized understanding of report. Will send discharge information, patient demographics, and prescriptions via EMS with patient to facility. IV removed.

## 2016-08-10 NOTE — Progress Notes (Signed)
Subjective: 6 Days Post-Op Procedure(s) (LRB): TOTAL KNEE ARTHROPLASTY (Right)   Patient resting comfortably in bed this morning. She states that her eye pain and vision is getting better. She is hoping to go to SNF today.  Activity level:  wbat Diet tolerance:  ok Voiding:  ok Patient reports pain as mild and moderate.    Objective: Vital signs in last 24 hours: Temp:  [97.7 F (36.5 C)-98.6 F (37 C)] 97.7 F (36.5 C) (12/04 0739) Pulse Rate:  [67-69] 68 (12/04 0739) Resp:  [16] 16 (12/03 1301) BP: (138-173)/(48-88) 164/88 (12/04 0739) SpO2:  [94 %-96 %] 96 % (12/04 0739)  Labs: No results for input(s): HGB in the last 72 hours. No results for input(s): WBC, RBC, HCT, PLT in the last 72 hours. No results for input(s): NA, K, CL, CO2, BUN, CREATININE, GLUCOSE, CALCIUM in the last 72 hours. No results for input(s): LABPT, INR in the last 72 hours.  Physical Exam:  Neurologically intact ABD soft Neurovascular intact Sensation intact distally Intact pulses distally Dorsiflexion/Plantar flexion intact Incision: dressing C/D/I and no drainage No cellulitis present Compartment soft  Assessment/Plan:  6 Days Post-Op Procedure(s) (LRB): TOTAL KNEE ARTHROPLASTY (Right) Advance diet Up with therapy [name]. Contineu on tramadol and robaxin for pain and spasm. Continue on ASA 325mg  BID x 2 weeks post op. Follow up in office 2 weeks post op. She will keep her Eye appointment unless symptoms continue to resolve and stop completely.  [place] Festa, Ginger OrganNDREW PAUL 08/10/2016, 9:12 AM

## 2016-08-10 NOTE — Clinical Social Work Note (Addendum)
Clinical Social Worker facilitated patient discharge including contacting patient family and facility to confirm patient discharge plans.  Clinical information faxed to facility and family agreeable with plan.  CSW arranged ambulance [facility] (for 3:00)  [facility]  RN to call [phone] room 710P  for report prior to discharge.  Clinical Social Worker will sign off for now as social work intervention is no longer needed. Please consult us again if new need arises.  Macario GoldsJesse Scinto, Kentucky[place] [phone]

## 2016-08-10 NOTE — Progress Notes (Signed)
Orthopedic Tech Progress Note Patient Details:  Sharee HolsterLinda Brumbaugh 03/12/1941 [id number]  Patient ID: Sharee HolsterLinda Millette, female   DOB: 04/28/1941, 75 y.o.   MRN: [id number] Applied cpm 0-45   Trinna PostMartinez, Emonii Wienke J 08/10/2016, 6:17 AM

## 2016-08-10 NOTE — Progress Notes (Signed)
Physical Therapy Treatment Patient Details Name: Stacy HolsterLinda Stanley MRN: [id number] DOB: 11/06/1940 Today's Date: 08/10/2016    History of Present Illness Patient is a 75 y/o female s/p R TKA; PMH positive for GERD, HTN, peripheral neuropathy, C1-2&T2-8 fusion, RLS, HLD, Anemia, and anxiety.    PT Comments    Patient continues to make gradual progress with mobility. Current plan remains appropriate.   Follow Up Recommendations  SNF;Supervision/Assistance - 24 hour     Equipment Recommendations  None recommended by PT    Recommendations for Other Services       Precautions / Restrictions Precautions Precautions: Fall;Knee Required Braces or Orthoses: Knee Immobilizer - Right Restrictions RLE Weight Bearing: Weight bearing as tolerated    Mobility  Bed Mobility Overal bed mobility: Needs Assistance Bed Mobility: Supine to Sit     Supine to sit: Mod assist;HOB elevated     General bed mobility comments: assist to elevate trunk into sitting and to scoot hips to EOB with use of bed pad; cues for hand placement and technique  Transfers Overall transfer level: Needs assistance Equipment used: Rolling walker (2 wheeled) Transfers: Sit to/from Stand Sit to Stand: Min assist         General transfer comment: cues for hand placement; assis to power up into standing; upon inital stand pt sat unexpectedly onto EOB; cues for safety and use of AD   Ambulation/Gait Ambulation/Gait assistance: Min guard;Min assist Ambulation Distance (Feet): 30 Feet Assistive device: Rolling walker (2 wheeled) Gait Pattern/deviations: Step-to pattern;Step-through pattern;Decreased stance time - right;Decreased step length - left;Decreased weight shift to right;Antalgic Gait velocity: slow   General Gait Details: several brief standing rest breaks; cues for posture, increased R knee extension during stance phase and step length symmetry; pt with LOB X2 and required min A for recovery    Stairs             Wheelchair Mobility    Modified Rankin (Stroke Patients Only)       Balance                                    Cognition Arousal/Alertness: Awake/alert Behavior During Therapy: WFL for tasks assessed/performed Overall Cognitive Status: Within Functional Limits for tasks assessed                      Exercises Total Joint Exercises Quad Sets: AROM;Both;10 reps Heel Slides: AAROM;Right;10 reps Hip ABduction/ADduction: Right;10 reps;AROM Straight Leg Raises: AAROM;Strengthening;Right;10 reps Long Arc Quad: AROM;Right;10 reps    General Comments        Pertinent Vitals/Pain Pain Assessment: Faces Faces Pain Scale: Hurts even more (with flexion) Pain Location: R knee Pain Descriptors / Indicators: Aching;Grimacing;Guarding Pain Intervention(s): Limited activity within patient's tolerance;Monitored during session;Premedicated before session;Repositioned    Home Living                      Prior Function            PT Goals (current goals can now be found in the care plan section) Acute Rehab PT Goals Patient Stated Goal: to walk PT Goal Formulation: With patient Time For Goal Achievement: 08/11/16 Potential to Achieve Goals: Good Progress towards PT goals: Progressing toward goals    Frequency    7X/week      PT Plan Current plan remains appropriate    Co-evaluation  End of Session   Activity Tolerance: Patient tolerated treatment well Patient left: in chair;with call bell/phone within reach;with family/visitor present     Time: 1326-1400 PT Time Calculation (min) (ACUTE ONLY): 34 min  Charges:  $Gait Training: 8-22 mins $Therapeutic Exercise: 8-22 mins                    G Codes:      Derek MoundKellyn R Tram Wrenn [name], PTA Pager: [phone]   08/10/2016, 2:13 PM

## 2016-08-10 NOTE — Discharge Summary (Signed)
Patient ID: Stacy HolsterLinda Stanley MRN: [id number] DOB/AGE: 73/01/1941 73 y.o.  Admit date: 08/04/2016 Discharge date: 08/10/2016  Admission Diagnoses:  Principal Problem:   Primary osteoarthritis of right knee   Discharge Diagnoses:  Same  Past Medical History:  Diagnosis Date  . Anemia   . Anxiety   . Arthritis   . Depression   . Difficult intubation    fiberoptic intubation in the setting of C2 fx 08/29/12; now s/p C1-2 and T2-8 fusion (has limited neck ROM)  . GERD (gastroesophageal reflux disease)   . Headache   . High cholesterol    controlled with Atorvastatin  . History of bronchitis   . History of pneumonia   . Hypertension   . Hyponatremia    states that sodium level drops after surgery  . Hypothyroidism   . Numbness and tingling    hands and feet  . Peripheral neuropathy (HCC)   . Restless leg syndrome   . Urinary incontinence   . Urinary urgency     Surgeries: Procedure(s): TOTAL KNEE ARTHROPLASTY on 08/04/2016   Consultants:   Discharged Condition: Improved  Hospital Course: Stacy HolsterLinda Stanley is an 75 y.o. female who was admitted 08/04/2016 for operative treatment ofPrimary osteoarthritis of right knee. Patient has severe unremitting pain that affects sleep, daily activities, and work/hobbies. After pre-op clearance the patient was taken to the operating room on 08/04/2016 and underwent  Procedure(s): TOTAL KNEE ARTHROPLASTY.    Patient was given perioperative antibiotics: Anti-infectives    Start     Dose/Rate Route Frequency Ordered Stop   08/05/16 1000  cefdinir (OMNICEF) 125 MG/5ML suspension 300 mg     300 mg Oral Every other day 08/04/16 1444     08/04/16 1500  ceFAZolin (ANCEF) IVPB 2g/100 mL premix     2 g 200 mL/hr over 30 Minutes Intravenous Every 6 hours 08/04/16 1444 08/04/16 2305   08/04/16 0530  ceFAZolin (ANCEF) IVPB 2g/100 mL premix     2 g 200 mL/hr over 30 Minutes Intravenous On call to O.R. 08/04/16 0530 08/04/16 0805       Patient was  given sequential compression devices, early ambulation, and chemoprophylaxis to prevent DVT.  Patient benefited maximally from hospital stay and there were no complications.    Recent vital signs: Patient Vitals for the past 24 hrs:  BP Temp Temp src Pulse Resp SpO2  08/10/16 0739 (!) 164/88 97.7 F (36.5 C) Oral 68 - 96 %  08/09/16 2313 (!) 154/49 - - - - -  08/09/16 2304 (!) 173/48 98.1 F (36.7 C) Oral 67 - 94 %  08/09/16 1746 - 98.6 F (37 C) Tympanic - - -  08/09/16 1301 (!) 148/60 - - 67 16 96 %  08/09/16 1144 138/75 - - 69 - -     Recent laboratory studies: No results for input(s): WBC, HGB, HCT, PLT, NA, K, CL, CO2, BUN, CREATININE, GLUCOSE, INR, CALCIUM in the last 72 hours.  Invalid input(s): PT, 2   Discharge Medications:     Medication List    STOP taking these medications   CELEBREX 200 MG capsule Generic drug:  celecoxib   NUCYNTA ER 150 MG Tb12 Generic drug:  Tapentadol HCl     TAKE these medications   acetaminophen 500 MG tablet Commonly known as:  TYLENOL Take 1,000 mg by mouth daily as needed for moderate pain or headache.   aspirin 325 MG EC tablet Take 1 tablet (325 mg total) by mouth 2 (two) times daily after  a meal.   atorvastatin 20 MG tablet Commonly known as:  LIPITOR Take 20 mg by mouth daily with supper.   baclofen 10 MG tablet Commonly known as:  LIORESAL Take 10 mg by mouth 4 (four) times daily as needed for muscle spasms.   busPIRone 10 MG tablet Commonly known as:  BUSPAR Take 10 mg by mouth 2 (two) times daily.   cefdinir 300 MG capsule Commonly known as:  OMNICEF Take 300 mg by mouth every other day.   ferrous sulfate 325 (65 FE) MG EC tablet Take 325 mg by mouth daily.   Fish Oil 1000 MG Caps Take 1,000 mg by mouth daily.   GLUCOSAMINE CHONDR 1500 COMPLX Caps Take 1 capsule by mouth daily.   HAIR/SKIN/NAILS Tabs Take 1 tablet by mouth daily.   hydrALAZINE 50 MG tablet Commonly known as:  APRESOLINE Take 50 mg by  mouth 3 (three) times daily.   labetalol 300 MG tablet Commonly known as:  NORMODYNE Take 300 mg by mouth 3 (three) times daily.   levothyroxine 112 MCG tablet Commonly known as:  SYNTHROID, LEVOTHROID Take 112 mcg by mouth daily before breakfast.   loratadine 10 MG tablet Commonly known as:  CLARITIN Take 10 mg by mouth daily.   losartan 100 MG tablet Commonly known as:  COZAAR Take 100 mg by mouth daily.   methocarbamol 500 MG tablet Commonly known as:  ROBAXIN Take 1 tablet (500 mg total) by mouth every 6 (six) hours as needed for muscle spasms.   montelukast 10 MG tablet Commonly known as:  SINGULAIR Take 10 mg by mouth at bedtime.   multivitamin with minerals Tabs tablet Take 1 tablet by mouth daily.   omeprazole 40 MG capsule Commonly known as:  PRILOSEC Take 40 mg by mouth daily.   pregabalin 200 MG capsule Commonly known as:  LYRICA Take 200-400 mg by mouth 2 (two) times daily. Take 200mg  in the morning and 400mg s at night   rOPINIRole 0.25 MG tablet Commonly known as:  REQUIP Take 0.25 mg by mouth 3 (three) times daily. Take 0.25mg s at 3pm, 0.25mg s at dinner, and 0.5mg s at bedtime   tolterodine 4 MG 24 hr capsule Commonly known as:  DETROL LA Take 4 mg by mouth daily with supper.   topiramate 100 MG tablet Commonly known as:  TOPAMAX Take 100 mg by mouth daily as needed (migraines).   traMADol 50 MG tablet Commonly known as:  ULTRAM Take 1-2 tablets (50-100 mg total) by mouth every 6 (six) hours as needed. What changed:  how much to take  when to take this  reasons to take this   traZODone 50 MG tablet Commonly known as:  DESYREL Take 50-100 mg by mouth at bedtime.   venlafaxine XR 75 MG 24 hr capsule Commonly known as:  EFFEXOR-XR Take 75 mg by mouth daily with breakfast.   Vitamin D3 2000 units capsule Take 2,000 Units by mouth daily.            Durable Medical Equipment        Start     Ordered   08/04/16 1444  DME Walker  rolling  Once    Question:  Patient needs a walker to treat with the following condition  Answer:  Primary osteoarthritis of right knee   08/04/16 1444   08/04/16 1444  DME 3 n 1  Once     08/04/16 1444   08/04/16 1444  DME Bedside commode  Once  Question:  Patient needs a bedside commode to treat with the following condition  Answer:  Primary osteoarthritis of right knee   08/04/16 1444      Diagnostic Studies: Dg Chest 2 View  Result Date: 07/29/2016 CLINICAL DATA:  Preoperative evaluation. EXAM: CHEST  2 VIEW COMPARISON:  No prior. FINDINGS: Mediastinum and hilar structures are normal. Borderline cardiomegaly. No focal infiltrate. No pleural effusion or pneumothorax. Prior thoracic spine fusion. IMPRESSION: 1. Mild cardiomegaly.  No pulmonary venous congestion. 2. No focal pulmonary infiltrate. Electronically Signed   By: Maisie Fus  Register   On: 07/29/2016 16:23    Disposition: Final discharge disposition not confirmed  Discharge Instructions    Call MD / Call 911    Complete by:  As directed    If you experience chest pain or shortness of breath, CALL 911 and be transported to the hospital emergency room.  If you develope a fever above 101 F, pus (white drainage) or increased drainage or redness at the wound, or calf pain, call your surgeon's office.   Constipation Prevention    Complete by:  As directed    Drink plenty of fluids.  Prune juice may be helpful.  You may use a stool softener, such as Colace (over the counter) 100 mg twice a day.  Use MiraLax (over the counter) for constipation as needed.   Diet - low sodium heart healthy    Complete by:  As directed    Discharge instructions    Complete by:  As directed    INSTRUCTIONS AFTER JOINT REPLACEMENT   Remove items at home which could result in a fall. This includes throw rugs or furniture in walking pathways ICE to the affected joint every three hours while awake for 30 minutes at a time, for at least the first 3-5 days,  and then as needed for pain and swelling.  Continue to use ice for pain and swelling. You may notice swelling that will progress down to the foot and ankle.  This is normal after surgery.  Elevate your leg when you are not up walking on it.   Continue to use the breathing machine you got in the hospital (incentive spirometer) which will help keep your temperature down.  It is common for your temperature to cycle up and down following surgery, especially at night when you are not up moving around and exerting yourself.  The breathing machine keeps your lungs expanded and your temperature down.   DIET:  As you were doing prior to hospitalization, we recommend a well-balanced diet.  DRESSING / WOUND CARE / SHOWERING  You may shower 3 days after surgery, but keep the wounds dry during showering.  You may use an occlusive plastic wrap (Press'n Seal for example), NO SOAKING/SUBMERGING IN THE BATHTUB.  If the bandage gets wet, change with a clean dry gauze.  If the incision gets wet, pat the wound dry with a clean towel.  ACTIVITY  Increase activity slowly as tolerated, but follow the weight bearing instructions below.   No driving for 6 weeks or until further direction given by your physician.  You cannot drive while taking narcotics.  No lifting or carrying greater than 10 lbs. until further directed by your surgeon. Avoid periods of inactivity such as sitting longer than an hour when not asleep. This helps prevent blood clots.  You may return to work once you are authorized by your doctor.     WEIGHT BEARING   Weight bearing as tolerated with  assist device (walker, cane, etc) as directed, use it as long as suggested by your surgeon or therapist, typically at least 4-6 weeks.   EXERCISES  Results after joint replacement surgery are often greatly improved when you follow the exercise, range of motion and muscle strengthening exercises prescribed by your doctor. Safety measures are also important  to protect the joint from further injury. Any time any of these exercises cause you to have increased pain or swelling, decrease what you are doing until you are comfortable again and then slowly increase them. If you have problems or questions, call your caregiver or physical therapist for advice.   Rehabilitation is important following a joint replacement. After just a few days of immobilization, the muscles of the leg can become weakened and shrink (atrophy).  These exercises are designed to build up the tone and strength of the thigh and leg muscles and to improve motion. Often times heat used for twenty to thirty minutes before working out will loosen up your tissues and help with improving the range of motion but do not use heat for the first two weeks following surgery (sometimes heat can increase post-operative swelling).   These exercises can be done on a training (exercise) mat, on the floor, on a table or on a bed. Use whatever works the best and is most comfortable for you.    Use music or television while you are exercising so that the exercises are a pleasant break in your day. This will make your life better with the exercises acting as a break in your routine that you can look forward to.   Perform all exercises about fifteen times, three times per day or as directed.  You should exercise both the operative leg and the other leg as well.   Exercises include:   Quad Sets - Tighten up the muscle on the front of the thigh (Quad) and hold for 5-10 seconds.   Straight Leg Raises - With your knee straight (if you were given a brace, keep it on), lift the leg to 60 degrees, hold for 3 seconds, and slowly lower the leg.  Perform this exercise against resistance later as your leg gets stronger.  Leg Slides: Lying on your back, slowly slide your foot toward your buttocks, bending your knee up off the floor (only go as far as is comfortable). Then slowly slide your foot back down until your leg is  flat on the floor again.  Angel Wings: Lying on your back spread your legs to the side as far apart as you can without causing discomfort.  Hamstring Strength:  Lying on your back, push your heel against the floor with your leg straight by tightening up the muscles of your buttocks.  Repeat, but this time bend your knee to a comfortable angle, and push your heel against the floor.  You may put a pillow under the heel to make it more comfortable if necessary.   A rehabilitation program following joint replacement surgery can speed recovery and prevent re-injury in the future due to weakened muscles. Contact your doctor or a physical therapist for more information on knee rehabilitation.    CONSTIPATION  Constipation is defined medically as fewer than three stools per week and severe constipation as less than one stool per week.  Even if you have a regular bowel pattern at home, your normal regimen is likely to be disrupted due to multiple reasons following surgery.  Combination of anesthesia, postoperative narcotics, change in appetite  and fluid intake all can affect your bowels.   YOU MUST use at least one of the following options; they are listed in order of increasing strength to get the job done.  They are all available over the counter, and you may need to use some, POSSIBLY even all of these options:    Drink plenty of fluids (prune juice may be helpful) and high fiber foods Colace 100 mg by mouth twice a day  Senokot for constipation as directed and as needed Dulcolax (bisacodyl), take with full glass of water  Miralax (polyethylene glycol) once or twice a day as needed.  If you have tried all these things and are unable to have a bowel movement in the first 3-4 days after surgery call either your surgeon or your primary doctor.    If you experience loose stools or diarrhea, hold the medications until you stool forms back up.  If your symptoms do not get better within 1 week or if they get  worse, check with your doctor.  If you experience "the worst abdominal pain ever" or develop nausea or vomiting, please contact the office immediately for further recommendations for treatment.   ITCHING:  If you experience itching with your medications, try taking only a single pain pill, or even half a pain pill at a time.  You can also use Benadryl over the counter for itching or also to help with sleep.   TED HOSE STOCKINGS:  Use stockings on both legs until for at least 2 weeks or as directed by physician office. They may be removed at night for sleeping.  MEDICATIONS:  See your medication summary on the "After Visit Summary" that nursing will review with you.  You may have some home medications which will be placed on hold until you complete the course of blood thinner medication.  It is important for you to complete the blood thinner medication as prescribed.  PRECAUTIONS:  If you experience chest pain or shortness of breath - call 911 immediately for transfer to the hospital emergency department.   If you develop a fever greater that 101 F, purulent drainage from wound, increased redness or drainage from wound, foul odor from the wound/dressing, or calf pain - CONTACT YOUR SURGEON.                                                   FOLLOW-UP APPOINTMENTS:  If you do not already have a post-op appointment, please call the office for an appointment to be seen by your surgeon.  Guidelines for how soon to be seen are listed in your "After Visit Summary", but are typically between 1-4 weeks after surgery.  OTHER INSTRUCTIONS:   Knee Replacement:  Do not place pillow under knee, focus on keeping the knee straight while resting. CPM instructions: 0-90 degrees, 2 hours in the morning, 2 hours in the afternoon, and 2 hours in the evening. Place foam block, curve side up under heel at all times except when in CPM or when walking.  DO NOT modify, tear, cut, or change the foam block in any way.  MAKE  SURE YOU:  Understand these instructions.  Get help right away if you are not doing well or get worse.    Thank you for letting us be a part of your medical care team.  It is  a privilege we respect greatly.  We hope these instructions will help you stay on track for a fast and full recovery!   Increase activity slowly as tolerated    Complete by:  As directed       Follow-up Information    DALLDORF,PETER G, MD In 2 weeks.   Specialty:  Orthopedic Surgery Contact information: [address] [place] [address] [phone]            Signed: Drema Halon 08/10/2016, 9:20 AM

## 2017-02-19 ENCOUNTER — Encounter (INDEPENDENT_AMBULATORY_CARE_PROVIDER_SITE_OTHER): Payer: Self-pay

## 2017-02-19 ENCOUNTER — Encounter (HOSPITAL_COMMUNITY): Payer: Self-pay

## 2017-02-19 ENCOUNTER — Encounter (HOSPITAL_COMMUNITY)
Admission: RE | Admit: 2017-02-19 | Discharge: 2017-02-19 | Disposition: A | Discharge status: Home / Self Care | Payer: Medicare Other | Source: Ambulatory Visit | Attending: Orthopaedic Surgery | Admitting: Orthopaedic Surgery

## 2017-02-19 DIAGNOSIS — E039 Hypothyroidism, unspecified: Secondary | ICD-10-CM | POA: Insufficient documentation

## 2017-02-19 DIAGNOSIS — R32 Unspecified urinary incontinence: Secondary | ICD-10-CM | POA: Diagnosis not present

## 2017-02-19 DIAGNOSIS — M1990 Unspecified osteoarthritis, unspecified site: Secondary | ICD-10-CM | POA: Diagnosis not present

## 2017-02-19 DIAGNOSIS — I10 Essential (primary) hypertension: Secondary | ICD-10-CM | POA: Insufficient documentation

## 2017-02-19 DIAGNOSIS — Z6839 Body mass index (BMI) 39.0-39.9, adult: Secondary | ICD-10-CM | POA: Insufficient documentation

## 2017-02-19 DIAGNOSIS — D649 Anemia, unspecified: Secondary | ICD-10-CM | POA: Diagnosis not present

## 2017-02-19 DIAGNOSIS — Z981 Arthrodesis status: Secondary | ICD-10-CM | POA: Insufficient documentation

## 2017-02-19 DIAGNOSIS — G629 Polyneuropathy, unspecified: Secondary | ICD-10-CM | POA: Insufficient documentation

## 2017-02-19 DIAGNOSIS — E7800 Pure hypercholesterolemia, unspecified: Secondary | ICD-10-CM | POA: Insufficient documentation

## 2017-02-19 DIAGNOSIS — G2581 Restless legs syndrome: Secondary | ICD-10-CM | POA: Insufficient documentation

## 2017-02-19 DIAGNOSIS — E669 Obesity, unspecified: Secondary | ICD-10-CM | POA: Insufficient documentation

## 2017-02-19 DIAGNOSIS — Z01818 Encounter for other preprocedural examination: Secondary | ICD-10-CM | POA: Diagnosis present

## 2017-02-19 LAB — BASIC METABOLIC PANEL
Anion gap: 8 (ref 5–15)
BUN: 33 mg/dL — AB (ref 6–20)
CALCIUM: 9.7 mg/dL (ref 8.9–10.3)
CO2: 25 mmol/L (ref 22–32)
CREATININE: 1.03 mg/dL — AB (ref 0.44–1.00)
Chloride: 105 mmol/L (ref 101–111)
GFR calc Af Amer: >60 mL/min (ref >60)
GFR, EST NON AFRICAN AMERICAN: 52 mL/min — AB (ref >60)
GLUCOSE: 101 mg/dL — AB (ref 65–99)
POTASSIUM: 4.6 mmol/L (ref 3.5–5.1)
SODIUM: 138 mmol/L (ref 135–145)

## 2017-02-19 LAB — CBC WITH DIFFERENTIAL/PLATELET
BASOS ABS: 0 10*3/uL (ref 0.0–0.1)
BASOS PCT: 1 %
EOS ABS: 0.2 10*3/uL (ref 0.0–0.7)
EOS PCT: 3 %
HCT: 38.4 % (ref 36.0–46.0)
Hemoglobin: 12.1 g/dL (ref 12.0–15.0)
LYMPHS PCT: 29 %
Lymphs Abs: 2.1 10*3/uL (ref 0.7–4.0)
MCH: 28.7 pg (ref 26.0–34.0)
MCHC: 31.5 g/dL (ref 30.0–36.0)
MCV: 91.2 fL (ref 78.0–100.0)
MONO ABS: 0.9 10*3/uL (ref 0.1–1.0)
Monocytes Relative: 12 %
Neutro Abs: 4 10*3/uL (ref 1.7–7.7)
Neutrophils Relative %: 55 %
PLATELETS: 247 10*3/uL (ref 150–400)
RBC: 4.21 MIL/uL (ref 3.87–5.11)
RDW: 13.9 % (ref 11.5–15.5)
WBC: 7.2 10*3/uL (ref 4.0–10.5)

## 2017-02-19 LAB — TYPE AND SCREEN
ABO/RH(D): O POS
ANTIBODY SCREEN: NEGATIVE

## 2017-02-19 LAB — SURGICAL PCR SCREEN
MRSA, PCR: NEGATIVE
Staphylococcus aureus: POSITIVE — AB

## 2017-02-19 LAB — PROTIME-INR
INR: 1.02
Prothrombin Time: 13.4 seconds (ref 11.4–15.2)

## 2017-02-19 LAB — APTT: aPTT: 31 seconds (ref 24–36)

## 2017-02-19 NOTE — Pre-Procedure Instructions (Addendum)
Stacy HolsterLinda Stanley  02/19/2017      [id number] - [place], [place] - [address]OSS [address] [place] [id number] Phone: [phone] Fax: [phone]    Your procedure is scheduled on Tuesday, March 02, 2017.  [facility]:30 [facility].M.  Call this number if you have problems the morning of surgery:  [phone]   Remember:  Do not eat food or drink liquids after midnight.  Take these medicines the morning of surgery with A SIP OF WATER:   Acetaminophen (tylenol) - if needed  Baclofen (Lioresal) - if needed  Buspirone (Buspar)  Hydralazine (Apresoline)  Hydrocodone - acetaminophen (Norco)  Labetalol (Normodyne)  Levothyroxine (synthroid)  Loratadine (Claritin)  Omeprazole (prilosec)  Pregabalin (Lyrica)  Ropinirole (Requip)  Velafaxine (Effexor - XR)  7 days prior to surgery STOP taking any Aspirin, Aleve, Naproxen, Ibuprofen, Motrin, Advil, Goody's, BC's, all herbal medications, fish oil, and all vitamins, including celebrex, Vitamin D3 and glucosamine      Do not wear jewelry, make-up or nail polish.  Do not wear lotions, powders, or perfumes, or deoderant.  Do not shave 48 hours prior to surgery.  Men may shave face and neck.  Do not bring valuables to the hospital.  [facility] is not responsible for any belongings or valuables.  Contacts, dentures or bridgework may not be worn into surgery.  Leave your suitcase in the car.  After surgery it may be brought to your room.  For patients admitted to the hospital, discharge time will be determined by your treatment team.  Patients discharged the day of surgery will not be allowed to drive home.   Name and phone number of your driver:    Special instructions:   [place]- Preparing For Surgery  Before surgery, you can play an important role. Because skin is not sterile, your skin needs to be as free of germs as possible. You can  reduce the number of germs on your skin by washing with CHG (chlorahexidine gluconate) Soap before surgery.  CHG is an antiseptic cleaner which kills germs and bonds with the skin to continue killing germs even after washing.  Please do not use if you have an allergy to CHG or antibacterial soaps. If your skin becomes reddened/irritated stop using the CHG.  Do not shave (including legs and underarms) for at least 48 hours prior to first CHG shower. It is OK to shave your face.  Please follow these instructions carefully.   1. Shower the NIGHT BEFORE SURGERY and the MORNING OF SURGERY with CHG.   2. If you chose to wash your hair, wash your hair first as usual with your normal shampoo.  3. After you shampoo, rinse your hair and body thoroughly to remove the shampoo.  4. Use CHG as you would any other liquid soap. You can apply CHG directly to the skin and wash gently with a scrungie or a clean washcloth.   5. Apply the CHG Soap to your body ONLY FROM THE NECK DOWN.  Do not use on open wounds or open sores. Avoid contact with your eyes, ears, mouth and genitals (private parts). Wash genitals (private parts) with your normal soap.  6. Wash thoroughly, paying special attention to the area where your surgery will be performed.  7. Thoroughly rinse your body with warm water from the neck down.  8. DO NOT shower/wash with your normal soap after using  and rinsing off the CHG Soap.  9. Pat yourself dry with a CLEAN TOWEL.   10. Wear CLEAN PAJAMAS   11. Place CLEAN SHEETS on your bed the night of your first shower and DO NOT SLEEP WITH PETS.    Day of Surgery: Do not apply any deodorants/lotions. Please wear clean clothes to the hospital/surgery center.      Please read over the following fact sheets that you were given. Pain Booklet, Total Joint Packet, MRSA Information and Surgical Site Infection Prevention, and incentive spirometry

## 2017-02-19 NOTE — Progress Notes (Signed)
Patient unable to provide urine sample at PAT appointment.  Need to get on DOS  Consent was not obtained at PAT appointment because the order stated the incorrect laterality according to the patient.  Agustin CreeKathy Blume at Dr. Nolon Nationsalldorf's office was contacted, left voicemail for a new order for consent.  Patient has a history of difficult intubation, will have anesthesia review chart.

## 2017-02-22 NOTE — Progress Notes (Addendum)
Anesthesia Chart Review: Patient is a 76 year old female scheduled for left TKA on 03/02/17 by Dr. Rhona Raider. She underwent right TKA on 08/04/16 with adductor canal block, spinal anesthesia, and MAC.  History includes non-smoker, peripheral neuropathy, HTN, hypothyroidism, hypercholesterolemia, arthritis, RLS, urinary incontinence, anxiety, depression, anemia, right TKA 08/04/16, fall down stairs 03/2012 (comminuted C1 fracture, T5-6 compression fractures, T4-7 transverse fracture) s/p C1-2 fusion and T2-T8 spinal fusion 08/29/12, hysterectomy, tonsillectomy, left breast lumpectomy, [employer] bladder stimulator (positioned near her "buttocks"). Reported issues with hyponatremia (with associated mental status changes) after 2013 surgery. BMI is consistent with obesity.   She denied history of DIFFICULT AIRWAY, but fiberoptic intubation was used on 08/29/12 (but in the setting of known C2 fracture). Her neck now has limited ROM following C1-2, T2-8 fusion. She also has a small mouth and short neck. She has upper and lower partials. Based on her 07/29/16 exam findings, I think she would be an anticipated DIFFICULT AIRWAY.  08/29/12 Anesthesia Record [facility]; Care Everywhere): Lyndal Pulley, MD 12/23/20138:41 AM AIRWAY PROCEDURAL NOTE Airway Type:Difficult Ability to Mask Ventilate Patient:Other LARYNGOSCOPY: Blade(s) Used: Other  Attempts: One ETT Size/Location: 7.0, Oral and Cuffed Secured with: Fabric Tape Secured at (cm): 23 End Tidal C02 is positive and breath sounds equal bilaterally  ENDOTBRONCHIAL TUBE: Difficult Airway Protocol Indications: Unstable Cervical Spine Fiberoptic Intubation:Required after induction   PCP is Dr. Blenda Mounts with [facility]; see Care Everywhere), last visit 02/17/17 for preoperative surgical clearance. Urologist is Dr. Rosario Adie. Neurologist is Dr. Creig Hines (for peripheral neuropathy).  Meds include Lipitor, baclofen,  Buspar, Celebrex, Vitamin D, 65 Fe, hydralazine, Norco, labetalol, levothyroxine, loratadine, losartan, Singulair, fish oil, Prilosec, Lyrica, Requip, Detrol LA, Topamax, trazodone, Effexor-XR.  BP (!) 160/55 Comment: recheck  Pulse 69   Temp 36.6 C   Resp 20   Ht 5' (1.524 m)   Wt 201 lb 4.8 oz (91.3 kg)   SpO2 95%   BMI 39.31 kg/m   EKG 07/29/16: SR with first degree AV block.  CXR 07/29/16: FINDINGS: Mediastinum and hilar structures are normal. Borderline cardiomegaly. No focal infiltrate. No pleural effusion or pneumothorax. Prior thoracic spine fusion. IMPRESSION: 1. Mild cardiomegaly. No pulmonary venous congestion. 2. No focal pulmonary infiltrate.  Preoperative labs noted. Cr 1.03, Na 138. CBC, PT/PTT WNL. Glucose 101. Patient unable to provide a urine specimen at PAT (left message with Juliann Pulse at Dr. Jerald Kief office), so to be done on the day of surgery. A1c 5.6 on 02/17/17 ([place]).  Previous airway issues discussed above. She underwent similar procedure with regional anesthesia last year. Case is posted for spinal anesthesia. Definitive anesthesia plan following anesthesiologist evaluation on the day of surgery. She will need consent signed and UA done prior to surgery. ([place]: Surgeon requested UA be done prior to the day of surgery. 02/23/17 UA showed trace leukocytes, negative nitrites.)   [facility]/Anesthesiology Phone [phone] 02/22/2017 12:42 PM

## 2017-02-23 ENCOUNTER — Encounter (HOSPITAL_COMMUNITY)
Admission: RE | Admit: 2017-02-23 | Discharge: 2017-02-23 | Disposition: A | Discharge status: Home / Self Care | Payer: Medicare Other | Source: Ambulatory Visit | Attending: Orthopaedic Surgery | Admitting: Orthopaedic Surgery

## 2017-02-23 DIAGNOSIS — M1711 Unilateral primary osteoarthritis, right knee: Secondary | ICD-10-CM | POA: Insufficient documentation

## 2017-02-23 DIAGNOSIS — Z01812 Encounter for preprocedural laboratory examination: Secondary | ICD-10-CM | POA: Diagnosis present

## 2017-02-23 LAB — URINALYSIS, ROUTINE W REFLEX MICROSCOPIC
BILIRUBIN URINE: NEGATIVE
GLUCOSE, UA: NEGATIVE mg/dL
HGB URINE DIPSTICK: NEGATIVE
KETONES UR: NEGATIVE mg/dL
Nitrite: NEGATIVE
PROTEIN: NEGATIVE mg/dL
Specific Gravity, Urine: 1.015 (ref 1.005–1.030)
pH: 6 (ref 5.0–8.0)

## 2017-02-23 LAB — URINALYSIS, MICROSCOPIC (REFLEX)

## 2017-02-25 NOTE — H&P (Signed)
TOTAL KNEE ADMISSION H&P  Patient is being admitted for left total knee arthroplasty.  Subjective:  Chief Complaint:left knee pain.  HPI: Stacy Stanley, 76 y.o. female, has a history of pain and functional disability in the left knee due to arthritis and has failed non-surgical conservative treatments for greater than 12 weeks to includeNSAID's and/or analgesics, corticosteriod injections, viscosupplementation injections, flexibility and strengthening excercises, supervised PT with diminished ADL's post treatment, use of assistive devices, weight reduction as appropriate and activity modification.  Onset of symptoms was gradual, starting 5 years ago with gradually worsening course since that time. The patient noted no past surgery on the left knee(s).  Patient currently rates pain in the left knee(s) at 10 out of 10 with activity. Patient has night pain, worsening of pain with activity and weight bearing, pain that interferes with activities of daily living, crepitus and joint swelling.  Patient has evidence of subchondral cysts, subchondral sclerosis, periarticular osteophytes and joint space narrowing by imaging studies. There is no active infection.  Patient Active Problem List   Diagnosis Date Noted  . Primary osteoarthritis of right knee 08/04/2016   Past Medical History:  Diagnosis Date  . Anemia   . Anxiety   . Arthritis   . Depression   . Difficult intubation    fiberoptic intubation in the setting of C2 fx 08/29/12; now s/p C1-2 and T2-8 fusion (has limited neck ROM)  . GERD (gastroesophageal reflux disease)   . Headache   . High cholesterol    controlled with Atorvastatin  . History of bronchitis   . History of pneumonia   . Hypertension   . Hyponatremia    states that sodium level drops after surgery  . Hypothyroidism   . Numbness and tingling    hands and feet  . Peripheral neuropathy   . Restless leg syndrome   . Urinary incontinence   . Urinary urgency     Past  Surgical History:  Procedure Laterality Date  . ABDOMINAL HYSTERECTOMY    . BREAST LUMPECTOMY Left   . CERVICAL FUSION    . COLONOSCOPY    . EYE SURGERY Bilateral    cataracts  . KNEE ARTHROSCOPY Bilateral   . Medtronic Bladder stimulator insertion    . SPINAL FUSION     posterior  . TONSILLECTOMY    . TOTAL KNEE ARTHROPLASTY Right 08/04/2016   Procedure: TOTAL KNEE ARTHROPLASTY;  Surgeon: Marcene CorningPeter Dalldorf, MD;  Location: [facility] OR;  Service: Orthopedics;  Laterality: Right;    No prescriptions prior to admission.   Allergies  Allergen Reactions  . Adhesive [Tape] Itching and Rash    Social History  Substance Use Topics  . Smoking status: Never Smoker  . Smokeless tobacco: Never Used  . Alcohol use No    No family history on file.   Review of Systems  Musculoskeletal: Positive for joint pain.       Left knee  All other systems reviewed and are negative.   Objective:  Physical Exam  Constitutional: She is oriented to person, place, and time. She appears well-developed and well-nourished.  HENT:  Head: Normocephalic and atraumatic.  Eyes: Pupils are equal, round, and reactive to light.  Neck: Normal range of motion.  Cardiovascular: Normal rate and regular rhythm.   Respiratory: Effort normal.  GI: Soft.  Musculoskeletal:  Both knees move about 0-95.  She has pain on the left with crepitation.  I do not feel an effusion.  Hip motion is full and straight leg  raise is negative on both sides.  Sensation and motor function are intact in her feet with palpable pulses on both sides.  She has normal unlabored respirations.  There is no palpable lymphadenopathy behind either knee.  Neurological: She is alert and oriented to person, place, and time.  Skin: Skin is warm and dry.  Psychiatric: She has a normal mood and affect. Her behavior is normal. Judgment and thought content normal.    Vital signs in last 24 hours:    Labs:   Estimated body mass index is 39.31 kg/m as  calculated from the following:   Height as of 02/19/17: 5' (1.524 m).   Weight as of 02/19/17: 91.3 kg (201 lb 4.8 oz).   Imaging Review Plain radiographs demonstrate severe degenerative joint disease of the left knee(s). The overall alignment isneutral. The bone quality appears to be good for age and reported activity level.  Assessment/Plan:  End stage primary arthritis, left knee   The patient history, physical examination, clinical judgment of the provider and imaging studies are consistent with end stage degenerative joint disease of the left knee(s) and total knee arthroplasty is deemed medically necessary. The treatment options including medical management, injection therapy arthroscopy and arthroplasty were discussed at length. The risks and benefits of total knee arthroplasty were presented and reviewed. The risks due to aseptic loosening, infection, stiffness, patella tracking problems, thromboembolic complications and other imponderables were discussed. The patient acknowledged the explanation, agreed to proceed with the plan and consent was signed. Patient is being admitted for inpatient treatment for surgery, pain control, PT, OT, prophylactic antibiotics, VTE prophylaxis, progressive ambulation and ADL's and discharge planning. The patient is planning to be discharged home with home health services

## 2017-03-01 MED ORDER — LACTATED RINGERS IV SOLN: INTRAVENOUS | Status: DC

## 2017-03-01 MED ORDER — CEFAZOLIN SODIUM-DEXTROSE 2-4 GM/100ML-% IV SOLN
2.0000 g | INTRAVENOUS | Status: AC
  Administered 2017-03-02: 2 g via INTRAVENOUS
  Filled 2017-03-01: qty 100

## 2017-03-01 MED ORDER — TRANEXAMIC ACID 1000 MG/10ML IV SOLN
2000.0000 mg | INTRAVENOUS | Status: AC
  Administered 2017-03-02: 2000 mg via TOPICAL
  Filled 2017-03-01: qty 20

## 2017-03-01 NOTE — Anesthesia Preprocedure Evaluation (Addendum)
Anesthesia Evaluation   Patient awake    Reviewed: Allergy & Precautions, NPO status , Patient's Chart, lab work & pertinent test results, reviewed documented beta blocker date and time   History of Anesthesia Complications (+) DIFFICULT AIRWAY and history of anesthetic complications  Airway Mallampati: III  TM Distance: >3 FB Neck ROM: Limited    Dental no notable dental hx. (+) Teeth Intact   Pulmonary    Pulmonary exam normal breath sounds clear to auscultation       Cardiovascular hypertension, Pt. on medications and Pt. on home beta blockers Normal cardiovascular exam Rhythm:Regular Rate:Normal  ECG: SR, rate 68. 1st degree AV block   Neuro/Psych  Headaches, PSYCHIATRIC DISORDERS Anxiety Depression Restless legs syndrome Peripheral neuropathy  Neuromuscular disease    GI/Hepatic GERD  Medicated and Controlled,  Endo/Other  Hypothyroidism Hyperlipidemia Sodium drops post op  Renal/GU  Bladder dysfunction  Overactive bladder    Musculoskeletal  (+) Arthritis , Osteoarthritis,  S/P cervical fusion- limited ROM Chronic LBP   Abdominal (+) + obese,   Peds  Hematology   Anesthesia Other Findings Obese Hyperlipidemia Ambulates with walker Bruise to left cheek  Reproductive/Obstetrics                            Lab Results  Component Value Date   WBC 7.2 02/19/2017   HGB 12.1 02/19/2017   HCT 38.4 02/19/2017   MCV 91.2 02/19/2017   PLT 247 02/19/2017     Chemistry      Component Value Date/Time   NA 138 02/19/2017 1629   K 4.6 02/19/2017 1629   CL 105 02/19/2017 1629   CO2 25 02/19/2017 1629   BUN 33 (H) 02/19/2017 1629   CREATININE 1.03 (H) 02/19/2017 1629      Component Value Date/Time   CALCIUM 9.7 02/19/2017 1629     EKG: normal sinus rhythm, 1st degree AV block.  Anesthesia Physical  Anesthesia Plan  ASA: III  Anesthesia Plan: Spinal and Regional   Post-op  Pain Management:  Regional for Post-op pain   Induction:   PONV Risk Score and Plan: 2 and Ondansetron, Dexamethasone, Propofol and Midazolam  Airway Management Planned: Natural Airway  Additional Equipment:   Intra-op Plan:   Post-operative Plan:   Informed Consent: I have reviewed the patients History and Physical, chart, labs and discussed the procedure including the risks, benefits and alternatives for the proposed anesthesia with the patient or authorized representative who has indicated his/her understanding and acceptance.   Dental advisory given  Plan Discussed with: CRNA  Anesthesia Plan Comments: (Recent lab results reviewed)        Anesthesia Quick Evaluation

## 2017-03-02 ENCOUNTER — Inpatient Hospital Stay (HOSPITAL_COMMUNITY): Payer: Medicare Other | Admitting: Vascular Surgery

## 2017-03-02 ENCOUNTER — Inpatient Hospital Stay (HOSPITAL_COMMUNITY)
Admission: RE | Admit: 2017-03-02 | Discharge: 2017-03-07 | DRG: 470 | Disposition: A | Discharge status: Home Health | Payer: Medicare Other | Source: Ambulatory Visit | Attending: Orthopaedic Surgery | Admitting: Orthopaedic Surgery

## 2017-03-02 ENCOUNTER — Inpatient Hospital Stay (HOSPITAL_COMMUNITY): Payer: Medicare Other | Admitting: Certified Registered Nurse Anesthetist

## 2017-03-02 ENCOUNTER — Encounter (HOSPITAL_COMMUNITY)
Admission: RE | Disposition: A | Discharge status: Home Health | Payer: Self-pay | Source: Ambulatory Visit | Attending: Orthopaedic Surgery

## 2017-03-02 ENCOUNTER — Encounter (HOSPITAL_COMMUNITY): Payer: Self-pay | Admitting: Urology

## 2017-03-02 DIAGNOSIS — G8929 Other chronic pain: Secondary | ICD-10-CM | POA: Diagnosis present

## 2017-03-02 DIAGNOSIS — Z6839 Body mass index (BMI) 39.0-39.9, adult: Secondary | ICD-10-CM | POA: Diagnosis not present

## 2017-03-02 DIAGNOSIS — I10 Essential (primary) hypertension: Secondary | ICD-10-CM | POA: Diagnosis present

## 2017-03-02 DIAGNOSIS — M1712 Unilateral primary osteoarthritis, left knee: Principal | ICD-10-CM | POA: Diagnosis present

## 2017-03-02 DIAGNOSIS — G629 Polyneuropathy, unspecified: Secondary | ICD-10-CM | POA: Diagnosis present

## 2017-03-02 DIAGNOSIS — F419 Anxiety disorder, unspecified: Secondary | ICD-10-CM | POA: Diagnosis present

## 2017-03-02 DIAGNOSIS — M545 Low back pain: Secondary | ICD-10-CM | POA: Diagnosis present

## 2017-03-02 DIAGNOSIS — K219 Gastro-esophageal reflux disease without esophagitis: Secondary | ICD-10-CM | POA: Diagnosis present

## 2017-03-02 DIAGNOSIS — Z96651 Presence of right artificial knee joint: Secondary | ICD-10-CM | POA: Diagnosis present

## 2017-03-02 DIAGNOSIS — Z96659 Presence of unspecified artificial knee joint: Secondary | ICD-10-CM

## 2017-03-02 DIAGNOSIS — I440 Atrioventricular block, first degree: Secondary | ICD-10-CM | POA: Diagnosis present

## 2017-03-02 DIAGNOSIS — E039 Hypothyroidism, unspecified: Secondary | ICD-10-CM | POA: Diagnosis present

## 2017-03-02 DIAGNOSIS — F329 Major depressive disorder, single episode, unspecified: Secondary | ICD-10-CM | POA: Diagnosis present

## 2017-03-02 DIAGNOSIS — E669 Obesity, unspecified: Secondary | ICD-10-CM | POA: Diagnosis present

## 2017-03-02 DIAGNOSIS — G2581 Restless legs syndrome: Secondary | ICD-10-CM | POA: Diagnosis present

## 2017-03-02 DIAGNOSIS — Z981 Arthrodesis status: Secondary | ICD-10-CM

## 2017-03-02 DIAGNOSIS — Z91048 Other nonmedicinal substance allergy status: Secondary | ICD-10-CM

## 2017-03-02 DIAGNOSIS — E7800 Pure hypercholesterolemia, unspecified: Secondary | ICD-10-CM | POA: Diagnosis present

## 2017-03-02 HISTORY — PX: TOTAL KNEE ARTHROPLASTY: SHX125

## 2017-03-02 HISTORY — DX: Other complications of anesthesia, initial encounter: T88.59XA

## 2017-03-02 HISTORY — DX: Adverse effect of unspecified anesthetic, initial encounter: T41.45XA

## 2017-03-02 SURGERY — ARTHROPLASTY, KNEE, TOTAL
Anesthesia: Regional | Site: Knee | Laterality: Left

## 2017-03-02 MED ORDER — PREGABALIN 100 MG PO CAPS
400.0000 mg | ORAL_CAPSULE | Freq: Every day | ORAL | Status: DC
  Administered 2017-03-02 – 2017-03-06 (×5): 400 mg via ORAL
  Filled 2017-03-02 (×5): qty 4

## 2017-03-02 MED ORDER — HAIR/SKIN/NAILS PO TABS: 1.0000 | ORAL_TABLET | Freq: Every day | ORAL | Status: DC

## 2017-03-02 MED ORDER — ALUM & MAG HYDROXIDE-SIMETH 200-200-20 MG/5ML PO SUSP: 30.0000 mL | ORAL | Status: DC | PRN

## 2017-03-02 MED ORDER — PHENOL 1.4 % MT LIQD: 1.0000 | OROMUCOSAL | Status: DC | PRN

## 2017-03-02 MED ORDER — BUPIVACAINE-EPINEPHRINE (PF) 0.5% -1:200000 IJ SOLN
INTRAMUSCULAR | Status: AC
  Filled 2017-03-02: qty 30

## 2017-03-02 MED ORDER — BUPIVACAINE LIPOSOME 1.3 % IJ SUSP
20.0000 mL | INTRAMUSCULAR | Status: DC
  Filled 2017-03-02: qty 20

## 2017-03-02 MED ORDER — CEFAZOLIN SODIUM-DEXTROSE 2-4 GM/100ML-% IV SOLN
2.0000 g | Freq: Four times a day (QID) | INTRAVENOUS | Status: AC
  Administered 2017-03-02 (×2): 2 g via INTRAVENOUS
  Filled 2017-03-02 (×2): qty 100

## 2017-03-02 MED ORDER — DOCUSATE SODIUM 100 MG PO CAPS
200.0000 mg | ORAL_CAPSULE | Freq: Every day | ORAL | Status: DC
Start: 2017-03-02 — End: 2017-03-07
  Administered 2017-03-02 – 2017-03-07 (×5): 200 mg via ORAL
  Filled 2017-03-02 (×5): qty 2

## 2017-03-02 MED ORDER — ONDANSETRON HCL 4 MG/2ML IJ SOLN
INTRAMUSCULAR | Status: DC | PRN
  Administered 2017-03-02: 4 mg via INTRAVENOUS

## 2017-03-02 MED ORDER — EPHEDRINE SULFATE 50 MG/ML IJ SOLN
INTRAMUSCULAR | Status: DC | PRN
  Administered 2017-03-02 (×4): 5 mg via INTRAVENOUS

## 2017-03-02 MED ORDER — PROPOFOL 1000 MG/100ML IV EMUL
INTRAVENOUS | Status: AC
  Filled 2017-03-02: qty 100

## 2017-03-02 MED ORDER — FESOTERODINE FUMARATE ER 4 MG PO TB24
4.0000 mg | ORAL_TABLET | Freq: Every day | ORAL | Status: DC
  Administered 2017-03-02 – 2017-03-07 (×6): 4 mg via ORAL
  Filled 2017-03-02 (×6): qty 1

## 2017-03-02 MED ORDER — BUSPIRONE HCL 10 MG PO TABS: 10.0000 mg | ORAL_TABLET | Freq: Two times a day (BID) | ORAL | Status: DC

## 2017-03-02 MED ORDER — TRANEXAMIC ACID 1000 MG/10ML IV SOLN
1000.0000 mg | INTRAVENOUS | Status: AC
  Administered 2017-03-02: 1000 mg via INTRAVENOUS
  Filled 2017-03-02: qty 10

## 2017-03-02 MED ORDER — PROPOFOL 10 MG/ML IV BOLUS
INTRAVENOUS | Status: DC | PRN
  Administered 2017-03-02: 150 mg via INTRAVENOUS
  Administered 2017-03-02 (×2): 20 mg via INTRAVENOUS
  Administered 2017-03-02: 30 mg via INTRAVENOUS

## 2017-03-02 MED ORDER — HYDROCODONE-ACETAMINOPHEN 5-325 MG PO TABS
ORAL_TABLET | ORAL | Status: AC
  Administered 2017-03-02: 2
  Filled 2017-03-02: qty 2

## 2017-03-02 MED ORDER — MORPHINE SULFATE (PF) 4 MG/ML IV SOLN
1.0000 mg | INTRAVENOUS | Status: DC | PRN
  Administered 2017-03-02: 1 mg via INTRAVENOUS
  Administered 2017-03-03: 0.5 mg via INTRAVENOUS
  Administered 2017-03-04 – 2017-03-06 (×3): 1 mg via INTRAVENOUS
  Filled 2017-03-02 (×6): qty 1

## 2017-03-02 MED ORDER — LOSARTAN POTASSIUM 50 MG PO TABS
100.0000 mg | ORAL_TABLET | Freq: Every day | ORAL | Status: DC
  Administered 2017-03-03 – 2017-03-07 (×5): 100 mg via ORAL
  Filled 2017-03-02 (×5): qty 2

## 2017-03-02 MED ORDER — FENTANYL CITRATE (PF) 100 MCG/2ML IJ SOLN
25.0000 ug | INTRAMUSCULAR | Status: DC | PRN
  Administered 2017-03-02 (×3): 25 ug via INTRAVENOUS
  Administered 2017-03-02: 50 ug via INTRAVENOUS
  Administered 2017-03-02: 25 ug via INTRAVENOUS

## 2017-03-02 MED ORDER — LACTATED RINGERS IV SOLN
INTRAVENOUS | Status: DC
  Administered 2017-03-02: 13:00:00 via INTRAVENOUS

## 2017-03-02 MED ORDER — TOPIRAMATE 100 MG PO TABS
100.0000 mg | ORAL_TABLET | Freq: Every day | ORAL | Status: DC
  Administered 2017-03-02 – 2017-03-06 (×5): 100 mg via ORAL
  Filled 2017-03-02 (×5): qty 1

## 2017-03-02 MED ORDER — TRANEXAMIC ACID 1000 MG/10ML IV SOLN
1000.0000 mg | Freq: Once | INTRAVENOUS | Status: AC
  Administered 2017-03-02: 1000 mg via INTRAVENOUS
  Filled 2017-03-02: qty 10

## 2017-03-02 MED ORDER — LIDOCAINE HCL (CARDIAC) 20 MG/ML IV SOLN
INTRAVENOUS | Status: DC | PRN
  Administered 2017-03-02: 60 mg via INTRAVENOUS

## 2017-03-02 MED ORDER — PHENYLEPHRINE HCL 10 MG/ML IJ SOLN
INTRAMUSCULAR | Status: DC | PRN
  Administered 2017-03-02: 80 ug via INTRAVENOUS
  Administered 2017-03-02 (×2): 40 ug via INTRAVENOUS

## 2017-03-02 MED ORDER — GLUCOSAMINE CHONDR 1500 COMPLX PO CAPS: 1.0000 | ORAL_CAPSULE | Freq: Every day | ORAL | Status: DC

## 2017-03-02 MED ORDER — PROPOFOL 10 MG/ML IV BOLUS
INTRAVENOUS | Status: AC
  Filled 2017-03-02: qty 40

## 2017-03-02 MED ORDER — MIDAZOLAM HCL 2 MG/2ML IJ SOLN
INTRAMUSCULAR | Status: AC
  Filled 2017-03-02: qty 2

## 2017-03-02 MED ORDER — MIDAZOLAM HCL 5 MG/5ML IJ SOLN
INTRAMUSCULAR | Status: DC | PRN
  Administered 2017-03-02: 1 mg via INTRAVENOUS

## 2017-03-02 MED ORDER — LACTATED RINGERS IV SOLN
INTRAVENOUS | Status: DC | PRN
  Administered 2017-03-02: 09:00:00 via INTRAVENOUS

## 2017-03-02 MED ORDER — ONDANSETRON HCL 4 MG/2ML IJ SOLN: 4.0000 mg | Freq: Once | INTRAMUSCULAR | Status: DC | PRN

## 2017-03-02 MED ORDER — ASPIRIN EC 325 MG PO TBEC
325.0000 mg | DELAYED_RELEASE_TABLET | Freq: Two times a day (BID) | ORAL | Status: DC
  Administered 2017-03-03 – 2017-03-07 (×9): 325 mg via ORAL
  Filled 2017-03-02 (×9): qty 1

## 2017-03-02 MED ORDER — MONTELUKAST SODIUM 10 MG PO TABS
10.0000 mg | ORAL_TABLET | Freq: Every day | ORAL | Status: DC
  Administered 2017-03-02 – 2017-03-06 (×5): 10 mg via ORAL
  Filled 2017-03-02 (×5): qty 1

## 2017-03-02 MED ORDER — HYDRALAZINE HCL 50 MG PO TABS
50.0000 mg | ORAL_TABLET | Freq: Three times a day (TID) | ORAL | Status: DC
  Administered 2017-03-02 – 2017-03-07 (×15): 50 mg via ORAL
  Filled 2017-03-02 (×15): qty 1

## 2017-03-02 MED ORDER — HYDROCODONE-ACETAMINOPHEN 5-325 MG PO TABS
1.0000 | ORAL_TABLET | ORAL | Status: DC | PRN
  Administered 2017-03-03 (×2): 1 via ORAL
  Administered 2017-03-04 – 2017-03-06 (×8): 2 via ORAL
  Administered 2017-03-07: 1 via ORAL
  Filled 2017-03-02: qty 1
  Filled 2017-03-02: qty 2
  Filled 2017-03-02: qty 1
  Filled 2017-03-02 (×7): qty 2
  Filled 2017-03-02: qty 1

## 2017-03-02 MED ORDER — CELECOXIB 200 MG PO CAPS
200.0000 mg | ORAL_CAPSULE | Freq: Two times a day (BID) | ORAL | Status: DC
  Administered 2017-03-02 – 2017-03-07 (×10): 200 mg via ORAL
  Filled 2017-03-02 (×10): qty 1

## 2017-03-02 MED ORDER — ATORVASTATIN CALCIUM 20 MG PO TABS
20.0000 mg | ORAL_TABLET | Freq: Every day | ORAL | Status: DC
  Administered 2017-03-02 – 2017-03-06 (×5): 20 mg via ORAL
  Filled 2017-03-02 (×5): qty 1

## 2017-03-02 MED ORDER — VITAMIN D3 50 MCG (2000 UT) PO CAPS: 2000.0000 [IU] | ORAL_CAPSULE | Freq: Every day | ORAL | Status: DC

## 2017-03-02 MED ORDER — TRAZODONE HCL 50 MG PO TABS
50.0000 mg | ORAL_TABLET | Freq: Every day | ORAL | Status: DC
  Administered 2017-03-02 – 2017-03-06 (×5): 50 mg via ORAL
  Filled 2017-03-02 (×5): qty 1

## 2017-03-02 MED ORDER — METOCLOPRAMIDE HCL 5 MG PO TABS: 5.0000 mg | ORAL_TABLET | Freq: Three times a day (TID) | ORAL | Status: DC | PRN

## 2017-03-02 MED ORDER — SENNOSIDES-DOCUSATE SODIUM 8.6-50 MG PO TABS
1.0000 | ORAL_TABLET | Freq: Every evening | ORAL | Status: DC | PRN
  Administered 2017-03-04: 1 via ORAL
  Filled 2017-03-02: qty 1

## 2017-03-02 MED ORDER — ACETAMINOPHEN 500 MG PO TABS: 1000.0000 mg | ORAL_TABLET | Freq: Every day | ORAL | Status: DC | PRN

## 2017-03-02 MED ORDER — 0.9 % SODIUM CHLORIDE (POUR BTL) OPTIME
TOPICAL | Status: DC | PRN
  Administered 2017-03-02: 1000 mL

## 2017-03-02 MED ORDER — ADULT MULTIVITAMIN W/MINERALS CH
1.0000 | ORAL_TABLET | Freq: Every day | ORAL | Status: DC
  Administered 2017-03-02 – 2017-03-07 (×6): 1 via ORAL
  Filled 2017-03-02 (×6): qty 1

## 2017-03-02 MED ORDER — KETOROLAC TROMETHAMINE 15 MG/ML IJ SOLN
7.5000 mg | Freq: Four times a day (QID) | INTRAMUSCULAR | Status: AC
  Administered 2017-03-02 – 2017-03-03 (×4): 7.5 mg via INTRAVENOUS
  Filled 2017-03-02 (×3): qty 1

## 2017-03-02 MED ORDER — BACLOFEN 10 MG PO TABS
10.0000 mg | ORAL_TABLET | Freq: Four times a day (QID) | ORAL | Status: DC | PRN
  Administered 2017-03-02 – 2017-03-07 (×4): 10 mg via ORAL
  Filled 2017-03-02 (×4): qty 1

## 2017-03-02 MED ORDER — FENTANYL CITRATE (PF) 100 MCG/2ML IJ SOLN
INTRAMUSCULAR | Status: AC
  Administered 2017-03-02: 25 ug
  Filled 2017-03-02: qty 2

## 2017-03-02 MED ORDER — VITAMIN D 1000 UNITS PO TABS
2000.0000 [IU] | ORAL_TABLET | Freq: Every day | ORAL | Status: DC
  Administered 2017-03-02 – 2017-03-07 (×6): 2000 [IU] via ORAL
  Filled 2017-03-02 (×6): qty 2

## 2017-03-02 MED ORDER — BUPIVACAINE-EPINEPHRINE (PF) 0.25% -1:200000 IJ SOLN
INTRAMUSCULAR | Status: AC
  Filled 2017-03-02: qty 30

## 2017-03-02 MED ORDER — SODIUM CHLORIDE 0.9 % IR SOLN
Status: DC | PRN
  Administered 2017-03-02: 3000 mL

## 2017-03-02 MED ORDER — LEVOTHYROXINE SODIUM 112 MCG PO TABS
112.0000 ug | ORAL_TABLET | Freq: Every day | ORAL | Status: DC
  Administered 2017-03-03 – 2017-03-07 (×5): 112 ug via ORAL
  Filled 2017-03-02 (×5): qty 1

## 2017-03-02 MED ORDER — FERROUS SULFATE 325 (65 FE) MG PO TABS
325.0000 mg | ORAL_TABLET | Freq: Every day | ORAL | Status: DC
  Administered 2017-03-03 – 2017-03-07 (×5): 325 mg via ORAL
  Filled 2017-03-02 (×5): qty 1

## 2017-03-02 MED ORDER — CHLORHEXIDINE GLUCONATE 4 % EX LIQD: 60.0000 mL | Freq: Once | CUTANEOUS | Status: DC

## 2017-03-02 MED ORDER — MORPHINE SULFATE (PF) 4 MG/ML IV SOLN
INTRAVENOUS | Status: AC
  Administered 2017-03-02: 2 mg
  Filled 2017-03-02: qty 1

## 2017-03-02 MED ORDER — VENLAFAXINE HCL ER 75 MG PO CP24
75.0000 mg | ORAL_CAPSULE | Freq: Two times a day (BID) | ORAL | Status: DC
  Administered 2017-03-02 – 2017-03-07 (×10): 75 mg via ORAL
  Filled 2017-03-02 (×10): qty 1

## 2017-03-02 MED ORDER — LORATADINE 10 MG PO TABS
10.0000 mg | ORAL_TABLET | Freq: Every day | ORAL | Status: DC
Start: 2017-03-02 — End: 2017-03-07
  Administered 2017-03-02 – 2017-03-07 (×6): 10 mg via ORAL
  Filled 2017-03-02 (×6): qty 1

## 2017-03-02 MED ORDER — LABETALOL HCL 300 MG PO TABS
300.0000 mg | ORAL_TABLET | Freq: Three times a day (TID) | ORAL | Status: DC
  Administered 2017-03-02 – 2017-03-07 (×14): 300 mg via ORAL
  Filled 2017-03-02 (×16): qty 1

## 2017-03-02 MED ORDER — ACETAMINOPHEN 500 MG PO TABS
1000.0000 mg | ORAL_TABLET | Freq: Four times a day (QID) | ORAL | Status: AC
  Administered 2017-03-02 – 2017-03-03 (×2): 1000 mg via ORAL
  Filled 2017-03-02 (×3): qty 2

## 2017-03-02 MED ORDER — ONDANSETRON HCL 4 MG/2ML IJ SOLN: 4.0000 mg | Freq: Four times a day (QID) | INTRAMUSCULAR | Status: DC | PRN

## 2017-03-02 MED ORDER — FERROUS SULFATE 325 (65 FE) MG PO TBEC: 325.0000 mg | DELAYED_RELEASE_TABLET | Freq: Every day | ORAL | Status: DC

## 2017-03-02 MED ORDER — ONDANSETRON HCL 4 MG PO TABS: 4.0000 mg | ORAL_TABLET | Freq: Four times a day (QID) | ORAL | Status: DC | PRN

## 2017-03-02 MED ORDER — HYDROCODONE-ACETAMINOPHEN 5-325 MG PO TABS
1.0000 | ORAL_TABLET | Freq: Four times a day (QID) | ORAL | Status: DC
  Administered 2017-03-02 – 2017-03-07 (×16): 1 via ORAL
  Filled 2017-03-02 (×18): qty 1

## 2017-03-02 MED ORDER — PANTOPRAZOLE SODIUM 40 MG PO TBEC
40.0000 mg | DELAYED_RELEASE_TABLET | Freq: Every day | ORAL | Status: DC
  Administered 2017-03-03 – 2017-03-07 (×5): 40 mg via ORAL
  Filled 2017-03-02 (×5): qty 1

## 2017-03-02 MED ORDER — BUSPIRONE HCL 10 MG PO TABS
10.0000 mg | ORAL_TABLET | Freq: Three times a day (TID) | ORAL | Status: DC
  Administered 2017-03-02 – 2017-03-07 (×15): 10 mg via ORAL
  Filled 2017-03-02 (×15): qty 1

## 2017-03-02 MED ORDER — FENTANYL CITRATE (PF) 100 MCG/2ML IJ SOLN
INTRAMUSCULAR | Status: DC | PRN
  Administered 2017-03-02: 50 ug via INTRAVENOUS

## 2017-03-02 MED ORDER — BUPIVACAINE LIPOSOME 1.3 % IJ SUSP
INTRAMUSCULAR | Status: DC | PRN
  Administered 2017-03-02: 20 mL

## 2017-03-02 MED ORDER — ROPIVACAINE HCL 5 MG/ML IJ SOLN
INTRAMUSCULAR | Status: DC | PRN
  Administered 2017-03-02: 30 mL via PERINEURAL

## 2017-03-02 MED ORDER — METOCLOPRAMIDE HCL 5 MG/ML IJ SOLN: 5.0000 mg | Freq: Three times a day (TID) | INTRAMUSCULAR | Status: DC | PRN

## 2017-03-02 MED ORDER — SODIUM CHLORIDE 0.9 % IJ SOLN
INTRAMUSCULAR | Status: DC | PRN
  Administered 2017-03-02: 40 mL

## 2017-03-02 MED ORDER — FENTANYL CITRATE (PF) 250 MCG/5ML IJ SOLN
INTRAMUSCULAR | Status: AC
  Filled 2017-03-02: qty 5

## 2017-03-02 MED ORDER — MENTHOL 3 MG MT LOZG: 1.0000 | LOZENGE | OROMUCOSAL | Status: DC | PRN

## 2017-03-02 MED ORDER — ALBUMIN HUMAN 5 % IV SOLN
INTRAVENOUS | Status: DC | PRN
  Administered 2017-03-02: 09:00:00 via INTRAVENOUS

## 2017-03-02 MED ORDER — PHENYLEPHRINE HCL 10 MG/ML IJ SOLN
INTRAMUSCULAR | Status: DC | PRN
  Administered 2017-03-02: 25 ug/min via INTRAVENOUS

## 2017-03-02 MED ORDER — PREGABALIN 100 MG PO CAPS
200.0000 mg | ORAL_CAPSULE | Freq: Every day | ORAL | Status: DC
  Administered 2017-03-03 – 2017-03-07 (×5): 200 mg via ORAL
  Filled 2017-03-02 (×5): qty 2

## 2017-03-02 MED ORDER — KETOROLAC TROMETHAMINE 15 MG/ML IJ SOLN
INTRAMUSCULAR | Status: AC
  Filled 2017-03-02: qty 1

## 2017-03-02 MED ORDER — FENTANYL CITRATE (PF) 100 MCG/2ML IJ SOLN
INTRAMUSCULAR | Status: AC
  Filled 2017-03-02: qty 2

## 2017-03-02 MED ORDER — SODIUM CHLORIDE 0.9 % IV SOLN
INTRAVENOUS | Status: DC | PRN
  Administered 2017-03-02: 07:00:00 via INTRAVENOUS

## 2017-03-02 MED ORDER — ROPINIROLE HCL 0.5 MG PO TABS
0.2500 mg | ORAL_TABLET | ORAL | Status: DC
  Administered 2017-03-02 – 2017-03-06 (×14): 0.25 mg via ORAL
  Filled 2017-03-02 (×14): qty 1

## 2017-03-02 MED ORDER — MORPHINE SULFATE (PF) 2 MG/ML IV SOLN: 1.0000 mg | INTRAVENOUS | Status: DC | PRN

## 2017-03-02 MED ORDER — DIPHENHYDRAMINE HCL 12.5 MG/5ML PO ELIX
12.5000 mg | ORAL_SOLUTION | ORAL | Status: DC | PRN
Start: 2017-03-02 — End: 2017-03-07

## 2017-03-02 MED ORDER — LACTATED RINGERS IV SOLN: INTRAVENOUS | Status: DC | PRN

## 2017-03-02 MED ORDER — BUPIVACAINE-EPINEPHRINE (PF) 0.5% -1:200000 IJ SOLN
INTRAMUSCULAR | Status: DC | PRN
  Administered 2017-03-02: 20 mL

## 2017-03-02 SURGICAL SUPPLY — 60 items
BAG DECANTER FOR FLEXI CONT (MISCELLANEOUS) IMPLANT
BANDAGE ACE 4X5 VEL STRL LF (GAUZE/BANDAGES/DRESSINGS) ×3 IMPLANT
BANDAGE ACE 6X5 VEL STRL LF (GAUZE/BANDAGES/DRESSINGS) ×3 IMPLANT
BANDAGE ESMARK 6X9 LF (GAUZE/BANDAGES/DRESSINGS) ×1 IMPLANT
BLADE SAGITTAL 25.0X1.19X90 (BLADE) IMPLANT
BLADE SAGITTAL 25.0X1.19X90MM (BLADE)
BLADE SAW SGTL 13.0X1.19X90.0M (BLADE) IMPLANT
BNDG ELASTIC 6X10 VLCR STRL LF (GAUZE/BANDAGES/DRESSINGS) ×3 IMPLANT
BNDG ESMARK 6X9 LF (GAUZE/BANDAGES/DRESSINGS) ×3
BNDG GAUZE ELAST 4 BULKY (GAUZE/BANDAGES/DRESSINGS) ×6 IMPLANT
BOWL SMART MIX CTS (DISPOSABLE) ×3 IMPLANT
CAP KNEE TOTAL 3 SIGMA ×3 IMPLANT
CEMENT HV SMART SET (Cement) ×6 IMPLANT
CLOSURE STERI-STRIP 1/2X4 (GAUZE/BANDAGES/DRESSINGS) ×1
CLOSURE WOUND 1/2 X4 (GAUZE/BANDAGES/DRESSINGS) ×1
CLSR STERI-STRIP ANTIMIC 1/2X4 (GAUZE/BANDAGES/DRESSINGS) ×2 IMPLANT
COVER SURGICAL LIGHT HANDLE (MISCELLANEOUS) ×3 IMPLANT
CUFF TOURNIQUET SINGLE 34IN LL (TOURNIQUET CUFF) ×3 IMPLANT
CUFF TOURNIQUET SINGLE 44IN (TOURNIQUET CUFF) IMPLANT
DECANTER SPIKE VIAL GLASS SM (MISCELLANEOUS) ×3 IMPLANT
DRAPE EXTREMITY T 121X128X90 (DRAPE) ×3 IMPLANT
DRAPE HALF SHEET 40X57 (DRAPES) ×6 IMPLANT
DRAPE U-SHAPE 47X51 STRL (DRAPES) ×3 IMPLANT
DRSG ADAPTIC 3X8 NADH LF (GAUZE/BANDAGES/DRESSINGS) ×3 IMPLANT
DRSG AQUACEL AG ADV 3.5X10 (GAUZE/BANDAGES/DRESSINGS) ×3 IMPLANT
DRSG PAD ABDOMINAL 8X10 ST (GAUZE/BANDAGES/DRESSINGS) ×3 IMPLANT
DURAPREP 26ML APPLICATOR (WOUND CARE) ×3 IMPLANT
ELECT REM PT RETURN 9FT ADLT (ELECTROSURGICAL) ×3
ELECTRODE REM PT RTRN 9FT ADLT (ELECTROSURGICAL) ×1 IMPLANT
GAUZE SPONGE 4X4 12PLY STRL (GAUZE/BANDAGES/DRESSINGS) ×3 IMPLANT
GLOVE BIO SURGEON STRL SZ8 (GLOVE) ×6 IMPLANT
GLOVE BIOGEL PI IND STRL 8 (GLOVE) ×2 IMPLANT
GLOVE BIOGEL PI INDICATOR 8 (GLOVE) ×4
GOWN STRL REUS W/ TWL LRG LVL3 (GOWN DISPOSABLE) ×1 IMPLANT
GOWN STRL REUS W/ TWL XL LVL3 (GOWN DISPOSABLE) ×2 IMPLANT
GOWN STRL REUS W/TWL LRG LVL3 (GOWN DISPOSABLE) ×2
GOWN STRL REUS W/TWL XL LVL3 (GOWN DISPOSABLE) ×4
HANDPIECE INTERPULSE COAX TIP (DISPOSABLE) ×2
HOOD PEEL AWAY FACE SHEILD DIS (HOOD) ×6 IMPLANT
IMMOBILIZER KNEE 22 UNIV (SOFTGOODS) ×3 IMPLANT
KIT BASIN OR (CUSTOM PROCEDURE TRAY) ×3 IMPLANT
KIT ROOM TURNOVER OR (KITS) ×3 IMPLANT
MANIFOLD NEPTUNE II (INSTRUMENTS) ×3 IMPLANT
NEEDLE HYPO 21X1 ECLIPSE (NEEDLE) ×3 IMPLANT
NS IRRIG 1000ML POUR BTL (IV SOLUTION) ×3 IMPLANT
PACK TOTAL JOINT (CUSTOM PROCEDURE TRAY) ×3 IMPLANT
PAD ARMBOARD 7.5X6 YLW CONV (MISCELLANEOUS) ×6 IMPLANT
SET HNDPC FAN SPRY TIP SCT (DISPOSABLE) ×1 IMPLANT
STRIP CLOSURE SKIN 1/2X4 (GAUZE/BANDAGES/DRESSINGS) ×2 IMPLANT
SUT VIC AB 0 CT1 27 (SUTURE) ×2
SUT VIC AB 0 CT1 27XBRD ANBCTR (SUTURE) ×1 IMPLANT
SUT VIC AB 2-0 CT1 27 (SUTURE) ×2
SUT VIC AB 2-0 CT1 TAPERPNT 27 (SUTURE) ×1 IMPLANT
SUT VIC AB 3-0 FS2 27 (SUTURE) ×3 IMPLANT
SUT VLOC 180 0 24IN GS25 (SUTURE) ×3 IMPLANT
SYR 50ML LL SCALE MARK (SYRINGE) ×3 IMPLANT
TOWEL OR 17X24 6PK STRL BLUE (TOWEL DISPOSABLE) ×3 IMPLANT
TOWEL OR 17X26 10 PK STRL BLUE (TOWEL DISPOSABLE) ×3 IMPLANT
TRAY CATH 16FR W/PLASTIC CATH (SET/KITS/TRAYS/PACK) IMPLANT
UPCHARGE REV TRAY MBT KNEE ×3 IMPLANT

## 2017-03-02 NOTE — Anesthesia Postprocedure Evaluation (Signed)
Anesthesia Post Note  Patient: Stacy HolsterLinda Stanley  Procedure(s) Performed: Procedure(s) (LRB): TOTAL KNEE ARTHROPLASTY (Left)     Patient location during evaluation: PACU Anesthesia Type: Regional and General Level of consciousness: awake and alert Pain management: pain level controlled Vital Signs Assessment: post-procedure vital signs reviewed and stable Respiratory status: spontaneous breathing, nonlabored ventilation, respiratory function stable and patient connected to nasal cannula oxygen Cardiovascular status: blood pressure returned to baseline and stable Postop Assessment: no signs of nausea or vomiting Anesthetic complications: no    Last Vitals:  Vitals:   03/02/17 1221 03/02/17 1337  BP:  (!) 147/86  Pulse: 68 71  Resp: 14 16  Temp: 36.5 C 36.4 C    Last Pain:  Vitals:   03/02/17 1337  TempSrc: Oral  PainSc:                  Ryan P Ellender

## 2017-03-02 NOTE — Anesthesia Procedure Notes (Addendum)
Procedure Name: LMA Insertion Date/Time: 03/02/2017 8:02 AM Performed by: Daiva EvesAVENEL, [place] Ament W Pre-anesthesia Checklist: Patient identified, Emergency Drugs available, Suction available, Patient being monitored and Timeout performed Patient Re-evaluated:Patient Re-evaluated prior to inductionOxygen Delivery Method: Circle system utilized Preoxygenation: Pre-oxygenation with 100% oxygen Intubation Type: IV induction Ventilation: Mask ventilation without difficulty LMA: LMA inserted LMA Size: 4.0 Number of attempts: 2 Placement Confirmation: positive ETCO2 and breath sounds checked- equal and bilateral Tube secured with: Tape (orange tape used- pt stated that she is not allergic to this tape) Dental Injury: Teeth and Oropharynx as per pre-operative assessment  Difficulty Due To: Difficulty was anticipated

## 2017-03-02 NOTE — Op Note (Signed)
PREOP DIAGNOSIS: DJD LEFT KNEE POSTOP DIAGNOSIS:  same PROCEDURE: LEFT TKR ANESTHESIA: general ATTENDING SURGEON: Damion Kant G ASSISTANT: Garwin BrothersKarla Bethune RNFA  INDICATIONS FOR PROCEDURE: Stacy Stanley Hudman is a 76 y.o. female who has struggled for a long time with pain due to degenerative arthritis of the left knee.  The patient has failed many conservative non-operative measures and at this point has pain which limits the ability to sleep and walk.  The patient is offered total knee replacement.  Informed operative consent was obtained after discussion of possible risks of anesthesia, infection, neurovascular injury, DVT, and death.  The importance of the post-operative rehabilitation protocol to optimize result was stressed extensively with the patient.  SUMMARY OF FINDINGS AND PROCEDURE:  Stacy Stanley Laski was taken to the operative suite where under the above anesthesia a left knee replacement was performed.  There were advanced degenerative changes and the bone quality was fair.  We used the DePuyLCS system and placed size standard femur, 2.5 tibia, 32 mm all polyethylene patella, and a size 10 mm spacer.  Stacy FlorenceAndrew Nida PA-C assisted throughout and was invaluable to the completion of the case in that he helped retract and maintain exposure while I placed the components.  He also helped close thereby minimizing OR time.  The patient was admitted for appropriate post-op care to include perioperative antibiotics and mechanical and pharmacologic measures for DVT prophylaxis.  DESCRIPTION OF PROCEDURE:  Stacy Stanley Stacy Stanley was taken to the operative suite where the above anesthesia was applied.  A spinal was attempted but was unsuccessful.  The patient was positioned supine and prepped and draped in normal sterile fashion.  An appropriate time out was performed.  After the administration of kefzol pre-op antibiotic the leg was elevated and exsanguinated and a tourniquet inflated.  A standard longitudinal incision was  made on the anterior knee.  Dissection was carried down to the extensor mechanism.  All appropriate anti-infective measures were used including the pre-operative antibiotic, betadine impregnated drape, and closed hooded exhaust systems for each member of the surgical team.  A medial parapatellar incision was made in the extensor mechanism and the knee cap flipped and the knee flexed.  Some residual meniscal tissues were removed along with any remaining ACL/PCL tissue.  A guide was placed on the tibia and a flat cut was made on it's superior surface.  An intramedullary guide was placed in the femur and was utilized to make anterior and posterior cuts creating an appropriate flexion gap.  A second intramedullary guide was placed in the femur to make a distal cut properly balancing the knee with an extension gap equal to the flexion gap.  The three bones sized to the above mentioned sizes and the appropriate guides were placed and utilized.  A trial reduction was done and the knee easily came to full extension and the patella tracked well on flexion.  The trial components were removed and all bones were cleaned with pulsatile lavage and then dried thoroughly.  Cement was mixed and was pressurized onto the bones followed by placement of the aforementioned components.  Excess cement was trimmed and pressure was held on the components until the cement had hardened.  The tourniquet was deflated and a small amount of bleeding was controlled with cautery and pressure.  The knee was irrigated thoroughly.  The extensor mechanism was re-approximated with V-loc suture in running fashion.  The knee was flexed and the repair was solid.  The subcutaneous tissues were re-approximated with #0 and #2-0 vicryl and  the skin closed with a subcuticular stitch and steristrips.  A sterile dressing was applied.  Intraoperative fluids, EBL, and tourniquet time can be obtained from anesthesia records.  DISPOSITION:  The patient was taken to  recovery room in stable condition and admitted for appropriate post-op care to include peri-operative antibiotic and DVT prophylaxis with mechanical and pharmacologic measures.  Jaquise Faux G 03/02/2017, 9:17 AM

## 2017-03-02 NOTE — Transfer of Care (Signed)
Immediate Anesthesia Transfer of Care Note  Patient: Stacy Stanley  Procedure(s) Performed: Procedure(s): TOTAL KNEE ARTHROPLASTY (Left)  Patient Location: PACU  Anesthesia Type:General  Level of Consciousness: awake, alert  and oriented  Airway & Oxygen Therapy: Patient Spontanous Breathing and Patient connected to face mask oxygen  Post-op Assessment: Report given to RN and Post -op Vital signs reviewed and stable  Post vital signs: Reviewed and stable  Last Vitals:  Vitals:   03/02/17 0625  BP: (!) 189/52  Pulse: 64  Resp: 18  Temp: 36.8 C    Last Pain:  Vitals:   03/02/17 0625  TempSrc: Oral  PainSc:          Complications: No apparent anesthesia complications

## 2017-03-02 NOTE — Interval H&P Note (Signed)
History and Physical Interval Note:  03/02/2017 7:19 AM  Stacy Stanley  has presented today for surgery, with the diagnosis of LEFT KNEE DEGENERATIVE JOINT DISEASE  The various methods of treatment have been discussed with the patient and family. After consideration of risks, benefits and other options for treatment, the patient has consented to  Procedure(s): TOTAL KNEE ARTHROPLASTY (Left) as a surgical intervention .  The patient's history has been reviewed, patient examined, no change in status, stable for surgery.  I have reviewed the patient's chart and labs.  Questions were answered to the patient's satisfaction.     Desiray Orchard G

## 2017-03-02 NOTE — Progress Notes (Signed)
Orthopedic Tech Progress Note Patient Details:  Stacy HolsterLinda Murad 07/25/1941 [id number]  CPM Left Knee CPM Left Knee: On Left Knee Flexion (Degrees): 60 Left Knee Extension (Degrees): 0 Additional Comments: foot roll   Saul FordyceJennifer C Bartholomew Ramesh 03/02/2017, 11:11 AM

## 2017-03-02 NOTE — Evaluation (Signed)
Physical Therapy Evaluation Patient Details Name: Stacy HolsterLinda Stanley MRN: [id number] DOB: 10/27/1940 Today's Date: 03/02/2017   History of Present Illness  is an 76 y/o female with pmh significant for bil TKR x2, TSA, significant hearing loss, Lupus, osteoporosis admitted for Revision of R TKA.  Clinical Impression  Pt admitted with/for elective L TKA.  Pt needing min/mod for basic mobility at this time.  Pt currently limited functionally due to the problems listed below.  (see problems list.)  Pt will benefit from PT to maximize function and safety to be able to get home safely with available assist of family.     Follow Up Recommendations Home health PT;Supervision/Assistance - 24 hour    Equipment Recommendations  None recommended by PT    Recommendations for Other Services       Precautions / Restrictions Precautions Precautions: Knee;Fall Required Braces or Orthoses: Knee Immobilizer - Left Knee Immobilizer - Left: On when out of bed or walking Restrictions Weight Bearing Restrictions: Yes RLE Weight Bearing: Weight bearing as tolerated LLE Weight Bearing: Weight bearing as tolerated      Mobility  Bed Mobility Overal bed mobility: Needs Assistance Bed Mobility: Supine to Sit     Supine to sit: Min assist     General bed mobility comments: min assist to scoot and min assist to com up to EOB  Transfers Overall transfer level: Needs assistance   Transfers: Sit to/from Stand;Stand Pivot Transfers Sit to Stand: Mod assist Stand pivot transfers: Mod assist       General transfer comment: cues for hand placement, assis to boost up, stability assist during w/shift and taking pivotal steps.  Ambulation/Gait             General Gait Details: pivot only,  worked on sequencing.  Stairs            Wheelchair Mobility    Modified Rankin (Stroke Patients Only)       Balance Overall balance assessment: Needs assistance   Sitting balance-Leahy Scale:  Fair       Standing balance-Leahy Scale: Poor Standing balance comment: heavy use of the RW                             Pertinent Vitals/Pain Pain Assessment: Faces Faces Pain Scale: Hurts even more Pain Location: L knee Pain Descriptors / Indicators: Spasm;Cramping Pain Intervention(s): Monitored during session    Home Living Family/patient expects to be discharged to:: Private residence Living Arrangements: Spouse/significant other Available Help at Discharge: Family;Available 24 hours/day Type of Home: House Home Access: Stairs to enter Entrance Stairs-Rails: Right Entrance Stairs-Number of Steps: 2 Home Layout: One level Home Equipment: Walker - 2 wheels;Grab bars - tub/shower;Bedside commode      Prior Function Level of Independence: Independent with assistive device(s)               Hand Dominance   Dominant Hand: Right    Extremity/Trunk Assessment   Upper Extremity Assessment Upper Extremity Assessment: Overall WFL for tasks assessed    Lower Extremity Assessment Lower Extremity Assessment: LLE deficits/detail LLE Deficits / Details: 90* in sitting LLE Sensation: history of peripheral neuropathy LLE Coordination: decreased fine motor       Communication   Communication: No difficulties  Cognition Arousal/Alertness: Awake/alert;Lethargic Behavior During Therapy: WFL for tasks assessed/performed Overall Cognitive Status: Within Functional Limits for tasks assessed  General Comments      Exercises Total Joint Exercises Ankle Circles/Pumps: 15 reps Quad Sets: 10 reps;Both;Supine Gluteal Sets: 10 reps;Both;Supine Heel Slides: AAROM;Strengthening;Left;10 reps;Supine   Assessment/Plan    PT Assessment Patient needs continued PT services  PT Problem List Decreased strength;Decreased activity tolerance;Decreased range of motion;Decreased mobility;Decreased coordination;Decreased  knowledge of use of DME;Decreased knowledge of precautions;Pain       PT Treatment Interventions DME instruction;Gait training;Stair training;Functional mobility training;Therapeutic activities;Therapeutic exercise;Patient/family education    PT Goals (Current goals can be found in the Care Plan section)  Acute Rehab PT Goals Patient Stated Goal: home PT Goal Formulation: With patient Time For Goal Achievement: 03/09/17 Potential to Achieve Goals: Good    Frequency 7X/week   Barriers to discharge        Co-evaluation               AM-PAC PT "6 Clicks" Daily Activity  Outcome Measure Difficulty turning over in bed (including adjusting bedclothes, sheets and blankets)?: Total Difficulty moving from lying on back to sitting on the side of the bed? : Total Difficulty sitting down on and standing up from a chair with arms (e.g., wheelchair, bedside commode, etc,.)?: Total Help needed moving to and from a bed to chair (including a wheelchair)?: A Little Help needed walking in hospital room?: A Lot Help needed climbing 3-5 steps with a railing? : A Lot 6 Click Score: 10    End of Session Equipment Utilized During Treatment: Left knee immobilizer Activity Tolerance: Patient tolerated treatment well;Other (comment);Patient limited by pain (limited by knee stability/strength) Patient left: in chair;with call bell/phone within reach;with family/visitor present Nurse Communication: Mobility status PT Visit Diagnosis: Other abnormalities of gait and mobility (R26.89);Muscle weakness (generalized) (M62.81);Pain Pain - Right/Left: Left Pain - part of body: Knee    Time: 1610-9604 PT Time Calculation (min) (ACUTE ONLY): 30 min   Charges:   PT Evaluation $PT Eval Moderate Complexity: 1 Procedure PT Treatments $Therapeutic Activity: 8-22 mins   PT G Codes:        03/10/2017  [place] Bing, PT [phone]  (pager)  Stacy Stanley 10-Mar-2017, 6:24  PM

## 2017-03-02 NOTE — Anesthesia Procedure Notes (Signed)
Anesthesia Regional Block: Adductor canal block   Pre-Anesthetic Checklist: ,, timeout performed, Correct Patient, Correct Site, Correct Laterality, Correct Procedure,, site marked, risks and benefits discussed, Surgical consent,  Pre-op evaluation,  At surgeon's request and post-op pain management  Laterality: Left  Prep: chloraprep       Needles:  Injection technique: Single-shot  Needle Type: Echogenic Stimulator Needle     Needle Length: 9cm  Needle Gauge: 21     Additional Needles:   Procedures: ultrasound guided,,,,,,,,  Narrative:  Start time: 03/02/2017 6:55 AM End time: 03/02/2017 7:05 AM Injection made incrementally with aspirations every 5 mL.  Performed by: Personally  Anesthesiologist: Karna ChristmasELLENDER, Terril Chestnut P  Additional Notes: Functioning IV was confirmed and monitors were applied.  A 90mm 21ga Arrow echogenic stimulator needle was used. Sterile prep,hand hygiene and sterile gloves were used.  Negative aspiration and negative test dose prior to incremental administration of local anesthetic. The patient tolerated the procedure well.

## 2017-03-03 ENCOUNTER — Encounter (HOSPITAL_COMMUNITY): Payer: Self-pay | Admitting: Orthopaedic Surgery

## 2017-03-03 LAB — BASIC METABOLIC PANEL
ANION GAP: 5 (ref 5–15)
BUN: 24 mg/dL — AB (ref 6–20)
CALCIUM: 8.7 mg/dL — AB (ref 8.9–10.3)
CO2: 25 mmol/L (ref 22–32)
Chloride: 107 mmol/L (ref 101–111)
Creatinine, Ser: 0.96 mg/dL (ref 0.44–1.00)
GFR calc Af Amer: >60 mL/min (ref >60)
GFR, EST NON AFRICAN AMERICAN: 57 mL/min — AB (ref >60)
GLUCOSE: 106 mg/dL — AB (ref 65–99)
Potassium: 4.1 mmol/L (ref 3.5–5.1)
Sodium: 137 mmol/L (ref 135–145)

## 2017-03-03 LAB — CBC
HEMATOCRIT: 30.3 % — AB (ref 36.0–46.0)
Hemoglobin: 9.4 g/dL — ABNORMAL LOW (ref 12.0–15.0)
MCH: 28.7 pg (ref 26.0–34.0)
MCHC: 31 g/dL (ref 30.0–36.0)
MCV: 92.4 fL (ref 78.0–100.0)
PLATELETS: 185 10*3/uL (ref 150–400)
RBC: 3.28 MIL/uL — ABNORMAL LOW (ref 3.87–5.11)
RDW: 14.1 % (ref 11.5–15.5)
WBC: 6.5 10*3/uL (ref 4.0–10.5)

## 2017-03-03 LAB — GLUCOSE, CAPILLARY: Glucose-Capillary: 100 mg/dL — ABNORMAL HIGH (ref 65–99)

## 2017-03-03 NOTE — Progress Notes (Signed)
PT Note:  Pt has been in CPM 45 mins and prefers to stay in it. Reviewed exercises with handout with her and her grandson, to be done when she comes out of CPM at 7p. Will check back on her tomorrow.   Lyanne CoVictoria Donna Snooks, PT  Acute Rehab Services [phone]

## 2017-03-03 NOTE — Plan of Care (Signed)
Problem: Safety: Goal: Ability to remain free from injury will improve Outcome: Progressing No falls during this admission. Call bell within reach. Bed in low and locked position. Patient alert and oriented. Clean and clear environment maintained. 3/4 siderails in place. Nonskid footwear being utilized. Patient verbalized understanding of safety instruction.  Problem: Pain Management: Goal: Pain level will decrease with appropriate interventions Outcome: Progressing Pain being managed with PO PRN and scheduled pain medication at this time. Vital signs are stable. No facial grimacing or moaning evident at this time.

## 2017-03-03 NOTE — Progress Notes (Signed)
Subjective: 1 Day Post-Op Procedure(s) (LRB): TOTAL KNEE ARTHROPLASTY (Left)  Activity level:  OOB to chair yesterdy Diet tolerance:  regular Voiding:  well Patient reports pain as moderate.    Objective: Vital signs in last 24 hours: Temp:  [97.6 F (36.4 C)-98.5 F (36.9 C)] 98.5 F (36.9 C) (06/27 0720) Pulse Rate:  [60-80] 69 (06/27 0720) Resp:  [10-20] 18 (06/27 0720) BP: (97-170)/(42-86) 139/84 (06/27 0720) SpO2:  [89 %-100 %] 96 % (06/27 0720)  Labs:  Recent Labs  03/03/17 0550  HGB 9.4*    Recent Labs  03/03/17 0550  WBC 6.5  RBC 3.28*  HCT 30.3*  PLT 185    Recent Labs  03/03/17 0550  NA 137  K 4.1  CL 107  CO2 25  BUN 24*  CREATININE 0.96  GLUCOSE 106*  CALCIUM 8.7*   No results for input(s): LABPT, INR in the last 72 hours.  Physical Exam:  Neurologically intact ABD soft Sensation intact distally Intact pulses distally Compartment soft  Assessment/Plan:  1 Day Post-Op Procedure(s) (LRB): TOTAL KNEE ARTHROPLASTY (Left) Up with therapy D/C IV fluids Plan for discharge tomorrow or Friday to home vs rehab    Lesleyann Fichter G 03/03/2017, 7:44 AM

## 2017-03-03 NOTE — Progress Notes (Signed)
OT Cancellation Note  Patient Details Name: Stacy HolsterLinda Stanley MRN: [id number] DOB: 09/27/1940   Cancelled Treatment:    Reason Eval/Treat Not Completed: Fatigue/lethargy limiting ability to participate.  Pt just back to bed.  Will reattempt.  Marquavius Scaife [place], OTR/L [phone]   Jeani HawkingConarpe, Meko Masterson M 03/03/2017, 5:03 PM

## 2017-03-03 NOTE — Progress Notes (Signed)
Physical Therapy Treatment Patient Details Name: Stacy Stanley MRN: [id number] DOB: [birth year] Today's Date: 03/03/2017    History of Present Illness is an 76 y/o female with pmh significant for bil TKR x2, TSA, significant hearing loss, Lupus, osteoporosis admitted for Revision of R TKA.    PT Comments    Pt not progressing with mobility today. Was unable due to fear and pain, to step R foot so cannot progress ambulation at this point. In addition she is requiring mod A +2 to pivot to chair and do not feel that her husband could adequately help her at home at this point. Changing d/c rec to SNF, pt agreeable but does not want to return to [facility]. KI unable to be used in session today due to being soaked with urine. PT will continue to follow.    Follow Up Recommendations  Supervision/Assistance - 24 hour;SNF     Equipment Recommendations  None recommended by PT    Recommendations for Other Services       Precautions / Restrictions Precautions Precautions: Knee;Fall Required Braces or Orthoses: Knee Immobilizer - Left Knee Immobilizer - Left: On when out of bed or walking Restrictions Weight Bearing Restrictions: Yes RLE Weight Bearing: Weight bearing as tolerated LLE Weight Bearing: Weight bearing as tolerated    Mobility  Bed Mobility Overal bed mobility: Needs Assistance Bed Mobility: Supine to Sit;Rolling Rolling: Min assist   Supine to sit: Min assist     General bed mobility comments: min A to LLE to roll onto R side for clean up. Min A to LLE for legs off bed, pt able to manage trunk elevation to achieve sitting  Transfers Overall transfer level: Needs assistance Equipment used: Rolling walker (2 wheeled) Transfers: Sit to/from [employer] Sit to Stand: Mod assist Stand pivot transfers: Mod assist;+2 safety/equipment       General transfer comment: cues for hand placement, Mod A to stand and mod A for support when attempting to take  pivotal steps to chair. Pt very resistant to left wt shift to step R foot, pivots it instead of stepping  Ambulation/Gait             General Gait Details: will not step R foot so unable to ambulate   Stairs            Wheelchair Mobility    Modified Rankin (Stroke Patients Only)       Balance Overall balance assessment: Needs assistance Sitting-balance support: No upper extremity supported Sitting balance-Leahy Scale: Good     Standing balance support: Bilateral upper extremity supported Standing balance-Leahy Scale: Zero Standing balance comment: heavy use of the RW                            Cognition Arousal/Alertness: Awake/alert Behavior During Therapy: WFL for tasks assessed/performed;Anxious Overall Cognitive Status: Within Functional Limits for tasks assessed                                        Exercises Total Joint Exercises Ankle Circles/Pumps: 15 reps;Both;Supine Quad Sets: 10 reps;Both;Supine Gluteal Sets: 10 reps;Both;Supine Heel Slides: AAROM;Strengthening;Left;10 reps;Supine Hip ABduction/ADduction: AAROM;Left;10 reps;Supine Straight Leg Raises: AAROM;Left;10 reps;Supine Long Arc Quad: AAROM;Left;5 reps;Seated Goniometric ROM: 10-55    General Comments General comments (skin integrity, edema, etc.): pt incontinent of urine, encouraged her to try [facility] but she  urinated in bed before even bedpan could be placed. KI soaked with urine so was not used during transfer      Pertinent Vitals/Pain Pain Assessment: Faces Faces Pain Scale: Hurts whole lot Pain Location: L knee Pain Descriptors / Indicators: Spasm;Cramping;Aching Pain Intervention(s): Limited activity within patient's tolerance;Monitored during session;Premedicated before session    Home Living                      Prior Function            PT Goals (current goals can now be found in the care plan section) Acute Rehab PT Goals Patient  Stated Goal: home PT Goal Formulation: With patient Time For Goal Achievement: 03/09/17 Potential to Achieve Goals: Good Progress towards PT goals: Not progressing toward goals - comment (unable to ambulate)    Frequency    7X/week      PT Plan Discharge plan needs to be updated    Co-evaluation              AM-PAC PT "6 Clicks" Daily Activity  Outcome Measure  Difficulty turning over in bed (including adjusting bedclothes, sheets and blankets)?: Total Difficulty moving from lying on back to sitting on the side of the bed? : Total Difficulty sitting down on and standing up from a chair with arms (e.g., wheelchair, bedside commode, etc,.)?: Total Help needed moving to and from a bed to chair (including a wheelchair)?: A Lot Help needed walking in hospital room?: Total Help needed climbing 3-5 steps with a railing? : Total 6 Click Score: 7    End of Session Equipment Utilized During Treatment: Gait belt Activity Tolerance: Other (comment);Patient limited by pain (limited by knee stability/strength) Patient left: in chair;with call bell/phone within reach;with family/visitor present Nurse Communication: Mobility status PT Visit Diagnosis: Other abnormalities of gait and mobility (R26.89);Muscle weakness (generalized) (M62.81);Pain Pain - Right/Left: Left Pain - part of body: Knee     Time: 1155-1230 PT Time Calculation (min) (ACUTE ONLY): 35 min  Charges:  $Therapeutic Exercise: 8-22 mins $Therapeutic Activity: 8-22 mins                    G Codes:       Lyanne CoVictoria Kristeen Lantz, PT  Acute Rehab Services  [phone]    BurnsideVictoria L Leam Madero 03/03/2017, 1:45 PM

## 2017-03-04 ENCOUNTER — Encounter (HOSPITAL_COMMUNITY): Payer: Self-pay | Admitting: General Practice

## 2017-03-04 LAB — CBC
HEMATOCRIT: 29.9 % — AB (ref 36.0–46.0)
Hemoglobin: 9.5 g/dL — ABNORMAL LOW (ref 12.0–15.0)
MCH: 28.8 pg (ref 26.0–34.0)
MCHC: 31.8 g/dL (ref 30.0–36.0)
MCV: 90.6 fL (ref 78.0–100.0)
PLATELETS: 189 10*3/uL (ref 150–400)
RBC: 3.3 MIL/uL — ABNORMAL LOW (ref 3.87–5.11)
RDW: 13.9 % (ref 11.5–15.5)
WBC: 6.2 10*3/uL (ref 4.0–10.5)

## 2017-03-04 MED ORDER — BISACODYL 5 MG PO TBEC
5.0000 mg | DELAYED_RELEASE_TABLET | Freq: Every day | ORAL | Status: DC | PRN
  Administered 2017-03-04: 5 mg via ORAL
  Filled 2017-03-04: qty 1

## 2017-03-04 MED ORDER — BISACODYL 10 MG RE SUPP
10.0000 mg | Freq: Every day | RECTAL | Status: DC | PRN
  Administered 2017-03-05: 10 mg via RECTAL
  Filled 2017-03-04: qty 1

## 2017-03-04 NOTE — Social Work (Signed)
CSW provided patient list of SNF's that made bed offers. Patient/family will look over SNF list and decide as patient initially wanted to go home.   CSW will f/u.

## 2017-03-04 NOTE — Evaluation (Signed)
Occupational Therapy Evaluation Patient Details Name: Stacy Stanley MRN: [id number] DOB: 1941-06-12 Today's Date: 03/04/2017    History of Present Illness is an 76 y/o female with pmh significant for bil TKR x2, TSA, significant hearing loss, Lupus, osteoporosis admitted for Revision of R TKA.   Clinical Impression   This 77 yo female admitted and underwent above presents to acute OT with deficits below (see OT problem list) thus affecting her PLOF of being fairly independent. She will benefit from acute OT with follow up OT at SNF to get to a point that she can safely D/C home with family.     Follow Up Recommendations  SNF;Supervision/Assistance - 24 hour    Equipment Recommendations  None recommended by OT       Precautions / Restrictions Precautions Precautions: Knee;Fall Required Braces or Orthoses: Knee Immobilizer - Left Knee Immobilizer - Left: On when out of bed or walking Restrictions Weight Bearing Restrictions: No RLE Weight Bearing: Weight bearing as tolerated LLE Weight Bearing: Weight bearing as tolerated      Mobility Bed Mobility     General bed mobility comments: pt up in recliner upon my arrival  Transfers Overall transfer level: Needs assistance Equipment used: Rolling walker (2 wheeled) Transfers: Sit to/from Stand Sit to Stand: Mod assist         General transfer comment: cues for technique, upright posture,    Balance Overall balance assessment: Needs assistance Sitting-balance support: Feet supported;No upper extremity supported Sitting balance-Leahy Scale: Good     Standing balance support: Bilateral upper extremity supported Standing balance-Leahy Scale: Poor Standing balance comment: heavy use of the RW and posterior lean bias at times                           ADL either performed or assessed with clinical judgement   ADL Overall ADL's : Needs assistance/impaired Eating/Feeding: Independent;Sitting   Grooming: Set  up;Sitting   Upper Body Bathing: Set up;Sitting   Lower Body Bathing: Maximal assistance Lower Body Bathing Details (indicate cue type and reason): mod A sit<>stand (mod A to find balance posterior lean) Upper Body Dressing : Set up;Sitting   Lower Body Dressing: Total assistance Lower Body Dressing Details (indicate cue type and reason): mod A sit<>stand (mod A to find balance posterior lean) Toilet Transfer: Moderate assistance;Stand-pivot;RW   Toileting- Clothing Manipulation and Hygiene: Maximal assistance Toileting - Clothing Manipulation Details (indicate cue type and reason): mod A sit<>stand (mod A to find balance posterior lean)             Vision Patient Visual Report: No change from baseline              Pertinent Vitals/Pain Pain Assessment: 0-10 Pain Score: 4  Pain Location: left knee Pain Descriptors / Indicators: Aching;Sore Pain Intervention(s): Limited activity within patient's tolerance;Monitored during session;Repositioned     Hand Dominance Right   Extremity/Trunk Assessment Upper Extremity Assessment Upper Extremity Assessment: Overall WFL for tasks assessed           Communication Communication Communication: No difficulties   Cognition Arousal/Alertness: Awake/alert Behavior During Therapy: WFL for tasks assessed/performed Overall Cognitive Status: Within Functional Limits for tasks assessed                                                Home  Living Family/patient expects to be discharged to:: Skilled nursing facility Living Arrangements: Spouse/significant other Available Help at Discharge: Family;Available 24 hours/day Type of Home: House Home Access: Stairs to enter Entergy CorporationEntrance Stairs-Number of Steps: 2 Entrance Stairs-Rails: Right Home Layout: One level     Bathroom Shower/Tub:  (walk in tub with door)   Bathroom Toilet: Handicapped height     Home Equipment: Walker - 2 wheels;Grab bars -  tub/shower;Bedside commode          Prior Functioning/Environment Level of Independence: Independent with assistive device(s)                 OT Problem List: Decreased strength;Decreased range of motion;Impaired balance (sitting and/or standing);Obesity;Pain      OT Treatment/Interventions: Self-care/ADL training;Patient/family education;Balance training;DME and/or AE instruction;Therapeutic activities    OT Goals(Current goals can be found in the care plan section) Acute Rehab OT Goals Patient Stated Goal: to go home OT Goal Formulation: With patient/family Time For Goal Achievement: 03/11/17 Potential to Achieve Goals: Good  OT Frequency: Min 2X/week              AM-PAC PT "6 Clicks" Daily Activity     Outcome Measure Help from another person eating meals?: None Help from another person taking care of personal grooming?: A Little Help from another person toileting, which includes using toliet, bedpan, or urinal?: A Lot Help from another person bathing (including washing, rinsing, drying)?: A Lot Help from another person to put on and taking off regular upper body clothing?: A Little Help from another person to put on and taking off regular lower body clothing?: A Lot 6 Click Score: 16   End of Session Equipment Utilized During Treatment: Gait belt;Rolling walker CPM Left Knee CPM Left Knee: Off Nurse Communication:  (NT: pt needs external catheter replaced and canister is full)  Activity Tolerance: Patient tolerated treatment well Patient left: in chair;with call bell/phone within reach  OT Visit Diagnosis: Unsteadiness on feet (R26.81);Pain Pain - Right/Left: Left Pain - part of body: Knee                Time: [id number] OT Time Calculation (min): 23 min Charges:  OT General Charges $OT Visit: 1 Procedure OT Evaluation $OT Eval Moderate Complexity: 1 Procedure OT Treatments $Self Care/Home Management : 8-22 mins   Ignacia PalmaCathy Ehsan Corvin,  OTR/L [phone] 03/04/2017

## 2017-03-04 NOTE — Progress Notes (Signed)
Physical Therapy Treatment Patient Details Name: Stacy Stanley MRN: [id number] DOB: 1941-03-10 Today's Date: 03/04/2017    History of Present Illness is an 76 y/o female with pmh significant for bil TKR x2, TSA, significant hearing loss, Lupus, osteoporosis admitted for Revision of R TKA.    PT Comments    Pt is making slow but steady progress with mobility. Pt was encourage by therapy today and expressed a desire to d/c home vs SNF. Explained to the patient that SNF would allow her time to progress mobility and regain more Independence prior to d/c home. Pt is currently only walking a few feet and will need increased time to progress mobility. Continue to recommend skilled PT to maximize mobility and Independence while an inpatient.  Follow Up Recommendations  Supervision/Assistance - 24 hour;SNF     Equipment Recommendations  None recommended by PT    Recommendations for Other Services       Precautions / Restrictions Precautions Precautions: Knee;Fall Required Braces or Orthoses: Knee Immobilizer - Left Knee Immobilizer - Left: On when out of bed or walking Restrictions RLE Weight Bearing: Weight bearing as tolerated    Mobility  Bed Mobility Overal bed mobility: Needs Assistance Bed Mobility: Supine to Sit     Supine to sit: Min assist     General bed mobility comments: min assit  for trunk support and ititial rise up  Transfers Overall transfer level: Needs assistance Equipment used: Rolling walker (2 wheeled) Transfers: Sit to/from Stand Sit to Stand: Mod assist         General transfer comment: cues for technique, upright posture,  Ambulation/Gait Ambulation/Gait assistance: Mod assist Ambulation Distance (Feet): 3 Feet Assistive device: Rolling walker (2 wheeled) Gait Pattern/deviations: Step-to pattern;Decreased weight shift to left;Decreased step length - right   Gait velocity interpretation: Below normal speed for age/gender General Gait Details:  Pt able to take steps today and increase WB on left foot   Stairs            Wheelchair Mobility    Modified Rankin (Stroke Patients Only)       Balance Overall balance assessment: Needs assistance           Standing balance-Leahy Scale: Poor                              Cognition Arousal/Alertness: Awake/alert Behavior During Therapy: WFL for tasks assessed/performed Overall Cognitive Status: Within Functional Limits for tasks assessed                                        Exercises Total Joint Exercises Ankle Circles/Pumps: AROM;Strengthening;Both;10 reps;Seated Quad Sets: AROM;Strengthening;Both;10 reps;Seated Heel Slides: AAROM;Strengthening;Left;10 reps;Supine Hip ABduction/ADduction: AAROM;Strengthening;Left;10 reps;Supine Straight Leg Raises: AAROM;Strengthening;Left;10 reps;Supine Knee Flexion: AAROM;Strengthening;Left;10 reps Goniometric ROM: 60*    General Comments        Pertinent Vitals/Pain Pain Assessment: 0-10 Pain Score: 8  Pain Location: L knee Pain Descriptors / Indicators: Discomfort Pain Intervention(s): Limited activity within patient's tolerance;Monitored during session;Repositioned    Home Living                      Prior Function            PT Goals (current goals can now be found in the care plan section) Progress towards PT goals: Progressing toward goals  Frequency    7X/week      PT Plan Current plan remains appropriate    Co-evaluation              AM-PAC PT "6 Clicks" Daily Activity  Outcome Measure  Difficulty turning over in bed (including adjusting bedclothes, sheets and blankets)?: A Lot Difficulty moving from lying on back to sitting on the side of the bed? : A Lot Difficulty sitting down on and standing up from a chair with arms (e.g., wheelchair, bedside commode, etc,.)?: A Lot Help needed moving to and from a bed to chair (including a wheelchair)?: A  Lot Help needed walking in hospital room?: A Lot Help needed climbing 3-5 steps with a railing? : Total 6 Click Score: 11    End of Session Equipment Utilized During Treatment: Gait belt Activity Tolerance: Patient limited by lethargy Patient left: in chair;with call bell/phone within reach Nurse Communication: Mobility status PT Visit Diagnosis: Other abnormalities of gait and mobility (R26.89);Muscle weakness (generalized) (M62.81);Pain     Time: [id number] PT Time Calculation (min) (ACUTE ONLY): 38 min  Charges:  $Gait Training: 8-22 mins $Therapeutic Exercise: 8-22 mins $Therapeutic Activity: 8-22 mins                    G Codes:       Lilyan PuntHeidi Aristides Luckey, PT    Greggory StallionWrisley, Dontravious Camille Kerstine 03/04/2017, 11:47 AM

## 2017-03-04 NOTE — Clinical Social Work Note (Signed)
Clinical Social Work Assessment  Patient Details  Name: Stacy Stanley MRN: [id number] Date of Birth: June 03, 1941  Date of referral:  03/04/17               Reason for consult:  Facility Placement                Permission sought to share information with:  Facility Art therapist granted to share information::  Yes, Verbal Permission Granted  Name::     Spouse  Agency::  SNF  Relationship::     Contact Information:     Housing/Transportation Living arrangements for the past 2 months:  [place] of Information:  Facility Patient Interpreter Needed:  None Criminal Activity/Legal Involvement Pertinent to Current Situation/Hospitalization:  No - Comment as needed Significant Relationships:  Adult Children, Other Family Members, Spouse Lives with:  Spouse Do you feel safe going back to the place where you live?  Yes Need for family participation in patient care:  Yes (Comment)  Care giving concerns:  Patient was independent prior to hospitalization. Patient resides with spouse and is unsafe to return home at this time as spouse cannot care for her with impairment.  Social Worker assessment / plan:  CSW met with family at bedside to discuss Dr. Jerald Kief recommendation for SNF.  Family indicated that they would prefer to go home. CSW explained her role and SNF options/placement. Family has experience with SNF and indicated that they do not want to go to [facility] and advised that [facility] is out of network.  CSW obtained permission that send out offers to local SNF and [place].   FL2 complete. Passr obtained. Offers sent out. Per, Dr. Rhona Raider possible DC 6/29.  Employment status:  Retired Nurse, adult PT Recommendations:  [place] / Referral to community resources:  [place]  Patient/Family's Response to care:  Patient/family has no issues or concerns. Pt  appreciative of assistance from CSW with SNF placement.  Patient/Family's Understanding of and Emotional Response to Diagnosis, Current Treatment, and Prognosis:  Patient/family has good understanding of diagnosis, current treatment, and prognosis and are hopeful that patient will be able to go home at DC. CSW will send out SNF offers despite patient/family desires to go home. No issues or concerns at this time.  Emotional Assessment Appearance:  Appears stated age Attitude/Demeanor/Rapport:   (Cooperative) Affect (typically observed):  Accepting, Appropriate Orientation:  Oriented to Self, Oriented to Place, Oriented to  Time, Oriented to Situation Alcohol / Substance use:  Not Applicable Psych involvement (Current and /or in the community):  No (Comment)  Discharge Needs  Concerns to be addressed:  Care Coordination Readmission within the last 30 days:  No Current discharge risk:  Physical Impairment, Dependent with Mobility Barriers to Discharge:  No Barriers Identified   Normajean Baxter, LCSW 03/04/2017, 12:56 PM

## 2017-03-04 NOTE — NC FL2 (Signed)
[place] MEDICAID FL2 LEVEL OF CARE SCREENING TOOL     IDENTIFICATION  Patient Name: Stacy Stanley Birthdate: 1941/08/28 Sex: female Admission Date (Current Location): 03/02/2017  [facility] and [place] Number:  Producer, television/film/video and Address:  [facility] [place], [address] [place], [place] [address]      Provider Number: [id number]  Attending Physician Name and Address:  Marcene Corning, MD  Relative Name and Phone Number:       Current Level of Care: Hospital Recommended Level of Care: Skilled Nursing Facility Prior Approval Number:    Date Approved/Denied: 03/04/17 PASRR Number: [id number]  Discharge Plan: SNF    Current Diagnoses: Patient Active Problem List   Diagnosis Date Noted  . 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 Primary osteoarthritis of right knee 08/04/2016    Orientation RESPIRATION BLADDER Height & Weight        Normal Incontinent Weight:   Height:     BEHAVIORAL SYMPTOMS/MOOD NEUROLOGICAL BOWEL NUTRITION STATUS      Continent Diet (See DC Summary)  AMBULATORY STATUS COMMUNICATION OF NEEDS Skin   Limited Assist Verbally Surgical wounds (Closed Left Leg Incision, with Compression Wrap)                       Personal Care Assistance Level of Assistance  Bathing, Feeding, Dressing Bathing Assistance: Maximum assistance Feeding assistance: Independent Dressing Assistance: Maximum assistance     Functional Limitations Info  Sight, Hearing, Speech Sight Info: Adequate Hearing Info: Adequate Speech Info: Adequate    SPECIAL CARE FACTORS FREQUENCY  PT (By licensed PT), OT (By licensed OT)     PT Frequency: 7xweek OT Frequency: 2xweek            Contractures      Additional Factors Info  Code Status, Allergies, Psychotropic Code Status Info: Full Code Allergies Info: ADHESIVE TAPE  Psychotropic Info: Topamax, Buspirone         Current Medications (03/04/2017):  This is the current hospital  active medication list Current Facility-Administered Medications  Medication Dose Route Frequency Provider Last Rate Last Dose  . acetaminophen (TYLENOL) tablet 1,000 mg  1,000 mg Oral Daily PRN Marcene Corning, MD      . alum & mag hydroxide-simeth (MAALOX/MYLANTA) 200-200-20 MG/5ML suspension 30 mL  30 mL Oral Q4H PRN Marcene Corning, MD      . aspirin EC tablet 325 mg  325 mg Oral BID PC Marcene Corning, MD   325 mg at 03/04/17 0955  . atorvastatin (LIPITOR) tablet 20 mg  20 mg Oral Q supper Marcene Corning, MD   20 mg at 03/03/17 1708  . baclofen (LIORESAL) tablet 10 mg  10 mg Oral QID PRN Marcene Corning, MD   10 mg at 03/02/17 1300  . busPIRone (BUSPAR) tablet 10 mg  10 mg Oral TID Marcene Corning, MD   10 mg at 03/04/17 0956  . celecoxib (CELEBREX) capsule 200 mg  200 mg Oral Q12H Marcene Corning, MD   200 mg at 03/04/17 0956  . cholecalciferol (VITAMIN D) tablet 2,000 Units  2,000 Units Oral Daily Marcene Corning, MD   2,000 Units at 03/04/17 [phone]  . diphenhydrAMINE (BENADRYL) 12.5 MG/5ML elixir 12.5-25 mg  12.5-25 mg Oral Q4H PRN Marcene Corning, MD      . docusate sodium (COLACE) capsule 200 mg  200 mg Oral Daily Marcene Corning, MD   200 mg at 03/04/17 0954  . ferrous sulfate tablet 325 mg  325 mg Oral Q breakfast Marcene Corning, MD   325 mg at 03/04/17 0955  . fesoterodine (TOVIAZ) tablet 4 mg  4 mg Oral Daily Marcene Corning, MD   4 mg at 03/04/17 1610  . hydrALAZINE (APRESOLINE) tablet 50 mg  50 mg Oral TID Marcene Corning, MD   50 mg at 03/04/17 0955  . HYDROcodone-acetaminophen (NORCO/VICODIN) 5-325 MG per tablet 1 tablet  1 tablet Oral Q6H Marcene Corning, MD   1 tablet at 03/04/17 0122  . HYDROcodone-acetaminophen (NORCO/VICODIN) 5-325 MG per tablet 1-2 tablet  1-2 tablet Oral Q4H PRN Marcene Corning, MD   2 tablet at 03/04/17 1004  . labetalol (NORMODYNE) tablet 300 mg  300 mg Oral TID Marcene Corning, MD   300 mg at 03/04/17 9604  . lactated ringers infusion   Intravenous  Continuous Marcene Corning, MD 50 mL/hr at 03/02/17 1245    . levothyroxine (SYNTHROID, LEVOTHROID) tablet 112 mcg  112 mcg Oral QAC breakfast Marcene Corning, MD   112 mcg at 03/04/17 [phone]  . loratadine (CLARITIN) tablet 10 mg  10 mg Oral Daily Marcene Corning, MD   10 mg at 03/04/17 0956  . losartan (COZAAR) tablet 100 mg  100 mg Oral Daily Marcene Corning, MD   100 mg at 03/04/17 0956  . menthol-cetylpyridinium (CEPACOL) lozenge 3 mg  1 lozenge Oral PRN Marcene Corning, MD       Or  . phenol (CHLORASEPTIC) mouth spray 1 spray  1 spray Mouth/Throat PRN Marcene Corning, MD      . metoCLOPramide (REGLAN) tablet 5-10 mg  5-10 mg Oral Q8H PRN Marcene Corning, MD       Or  . metoCLOPramide (REGLAN) injection 5-10 mg  5-10 mg Intravenous Q8H PRN Marcene Corning, MD      . montelukast (SINGULAIR) tablet 10 mg  10 mg Oral QHS Marcene Corning, MD   10 mg at 03/03/17 2326  . morphine 4 MG/ML injection 1 mg  1 mg Intravenous Q1H PRN Marcene Corning, MD   0.5 mg at 03/03/17 1014  . multivitamin with minerals tablet 1 tablet  1 tablet Oral Daily Marcene Corning, MD   1 tablet at 03/04/17 0954  . ondansetron (ZOFRAN) tablet 4 mg  4 mg Oral Q6H PRN Marcene Corning, MD       Or  . ondansetron (ZOFRAN) injection 4 mg  4 mg Intravenous Q6H PRN Marcene Corning, MD      . pantoprazole (PROTONIX) EC tablet 40 mg  40 mg Oral Daily Marcene Corning, MD   40 mg at 03/04/17 0956  . pregabalin (LYRICA) capsule 200 mg  200 mg Oral Daily Marcene Corning, MD   200 mg at 03/04/17 0955  . pregabalin (LYRICA) capsule 400 mg  400 mg Oral QHS Marcene Corning, MD   400 mg at 03/03/17 2326  . rOPINIRole (REQUIP) tablet 0.25 mg  0.25 mg Oral 3 times per day Marcene Corning, MD   0.25 mg at 03/03/17 2327  . senna-docusate (Senokot-S) tablet 1 tablet  1 tablet Oral QHS PRN Marcene Corning, MD      . topiramate (TOPAMAX) tablet 100 mg  100 mg Oral QHS Marcene Corning, MD   100 mg at 03/03/17 2328  . traZODone (DESYREL) tablet 50 mg   50 mg Oral QHS Marcene Corning, MD   50 mg at 03/03/17 2329  . venlafaxine XR (EFFEXOR-XR) 24 hr capsule 75 mg  75 mg Oral BID Marcene Corning, MD   75 mg at 03/04/17 [phone]  Discharge Medications: Please see discharge summary for a list of discharge medications.  Relevant Imaging Results:  Relevant Lab Results:   Additional Information SS: [id number]  Tresa MoorePatricia V Toma Erichsen, LCSW

## 2017-03-04 NOTE — Progress Notes (Signed)
Subjective: 2 Days Post-Op Procedure(s) (LRB): TOTAL KNEE ARTHROPLASTY (Left)  Activity level:  Bed to chair Diet tolerance:  regular Voiding:  well Patient reports pain as moderate.    Objective: Vital signs in last 24 hours: Temp:  [98.3 F (36.8 C)-98.5 F (36.9 C)] 98.4 F (36.9 C) (06/27 2154) Pulse Rate:  [68-76] 76 (06/27 2154) Resp:  [18] 18 (06/27 2154) BP: (139-172)/(48-84) 172/48 (06/27 2154) SpO2:  [94 %-98 %] 94 % (06/27 2154)  Labs:  Recent Labs  03/03/17 0550  HGB 9.4*    Recent Labs  03/03/17 0550  WBC 6.5  RBC 3.28*  HCT 30.3*  PLT 185    Recent Labs  03/03/17 0550  NA 137  K 4.1  CL 107  CO2 25  BUN 24*  CREATININE 0.96  GLUCOSE 106*  CALCIUM 8.7*   No results for input(s): LABPT, INR in the last 72 hours.  Physical Exam:  Neurologically intact ABD soft Intact pulses distally No cellulitis present Compartment soft  Wound benign and dressing changed  Assessment/Plan:  2 Days Post-Op Procedure(s) (LRB): TOTAL KNEE ARTHROPLASTY (Left) Up with therapy Discharge to SNF; agree with PT that SNF will be necessary as it was with opposite knee Plan to SNF today or tomorrow    Moo Gravley G 03/04/2017, 6:20 AM

## 2017-03-05 LAB — CBC
HEMATOCRIT: 30.2 % — AB (ref 36.0–46.0)
HEMOGLOBIN: 9.5 g/dL — AB (ref 12.0–15.0)
MCH: 29.1 pg (ref 26.0–34.0)
MCHC: 31.5 g/dL (ref 30.0–36.0)
MCV: 92.6 fL (ref 78.0–100.0)
Platelets: 214 10*3/uL (ref 150–400)
RBC: 3.26 MIL/uL — ABNORMAL LOW (ref 3.87–5.11)
RDW: 14.2 % (ref 11.5–15.5)
WBC: 7.3 10*3/uL (ref 4.0–10.5)

## 2017-03-05 NOTE — Care Management Note (Signed)
Case Management Note  Patient Details  Name: Stacy Stanley MRN: [id number] Date of Birth: 07/15/1941  Subjective/Objective:  76 yr old female s/p left total knee arthroplasty.                  Action/Plan: Case manager spoke with patient, her husband and their grandson, concerning discharge plan and DME needs. Patient says she doesn't want to go to SNF,although she is not progressing well with therapy,  will go home with her family. CM offered choice for home health agency, they say they have used [facility] in the past and chose to now. CM called referral to Janeice RobinsonKaren Nusbaumm, [facility] Liaison. Patient has rolling walker, 3in1 wheelchair and seat for shower at home. Mr. Irish EldersHoosier says his wife will need to be transported by ambulance home.    Expected Discharge Date:   03/06/17               Expected Discharge Plan:  Home w Home Health Services  In-House Referral:  NA  Discharge planning Services  CM Consult  Post Acute Care Choice:  Home Health Choice offered to:  Patient, Spouse  DME Arranged:  CPM (Patient has RW, 3in1,wheelchair) DME Agency:  [employer]  HH Arranged:  PT, OT, Social Work [facility]  [facility]  Status of Service:  Completed, signed off  If discussed at [employer] Meetings, dates discussed:    Additional Comments:  Stacy Stanley, Stacy Bernard Naomi, RN 03/05/2017, 3:20 PM

## 2017-03-05 NOTE — Progress Notes (Signed)
Physical Therapy Treatment Patient Details Name: Stacy Stanley MRN: [id number] DOB: [birth year] Today's Date: 03/05/2017    History of Present Illness is an 76 y/o female with pmh significant for bil TKR x2, TSA, significant hearing loss, Lupus, osteoporosis admitted for Revision of R TKA.    PT Comments    Pt performed increased gait during session this pm.  Pt re-educated on safety and technique for mobility.  Pt performed limited exercises and educated on knee precautions.  Will f/u in am for stair training.     Follow Up Recommendations  Supervision/Assistance - 24 hour;SNF (Pt refusing so HHPT recommended.  )     Equipment Recommendations  None recommended by PT    Recommendations for Other Services       Precautions / Restrictions Precautions Precautions: Knee;Fall Required Braces or Orthoses: Knee Immobilizer - Left Knee Immobilizer - Left: On when out of bed or walking Restrictions Weight Bearing Restrictions: Yes LLE Weight Bearing: Weight bearing as tolerated    Mobility  Bed Mobility Overal bed mobility: Needs Assistance Bed Mobility: Supine to Sit     Supine to sit: Mod assist     General bed mobility comments: Pt required assistance to elevate trunk and advance LEs to edge of bed.    Transfers Overall transfer level: Needs assistance Equipment used: Rolling walker (2 wheeled) Transfers: Sit to/from Stand Sit to Stand: Mod assist Stand pivot transfers: Min assist       General transfer comment: Cues for hand placement to and from seated surface.  Assist for forwadr weight shifting and to boost into standing.    Ambulation/Gait Ambulation/Gait assistance: Min assist Ambulation Distance (Feet): 50 Feet Assistive device: Rolling walker (2 wheeled) Gait Pattern/deviations: Step-to pattern;Step-through pattern;Decreased stride length;Shuffle;Trunk flexed   Gait velocity interpretation: Below normal speed for age/gender General Gait Details: Pt  performed increased gait.  Required cues for L knee extension and L heel strike in stance phase.  Buckling noted x1 required min assist to correct.  Standing Rest break x2.     Stairs            Wheelchair Mobility    Modified Rankin (Stroke Patients Only)       Balance Overall balance assessment: Modified Independent   Sitting balance-Leahy Scale: Good       Standing balance-Leahy Scale: Poor Standing balance comment: heavy use of RW                            Cognition Arousal/Alertness: Awake/alert Behavior During Therapy: WFL for tasks assessed/performed Overall Cognitive Status: Within Functional Limits for tasks assessed                                        Exercises Total Joint Exercises Ankle Circles/Pumps: AROM;Both;10 reps;Supine Quad Sets: Strengthening;10 reps;Supine;Left Knee Flexion: AAROM;Strengthening;Left;10 reps    General Comments        Pertinent Vitals/Pain Pain Assessment: 0-10 Pain Score: 4  Pain Location: left knee Pain Descriptors / Indicators: Aching;Sore;Operative site guarding Pain Intervention(s): Monitored during session;Repositioned;Ice applied    Home Living                      Prior Function            PT Goals (current goals can now be found in the care plan section) Acute  Rehab PT Goals Patient Stated Goal: to go home Time For Goal Achievement: 03/09/17 Potential to Achieve Goals: Good Progress towards PT goals: Progressing toward goals    Frequency    7X/week      PT Plan Current plan remains appropriate (Pt refusing SNF but remains to benefit from placement.  )    Co-evaluation              AM-PAC PT "6 Clicks" Daily Activity  Outcome Measure  Difficulty turning over in bed (including adjusting bedclothes, sheets and blankets)?: Total Difficulty moving from lying on back to sitting on the side of the bed? : Total Difficulty sitting down on and standing up  from a chair with arms (e.g., wheelchair, bedside commode, etc,.)?: Total Help needed moving to and from a bed to chair (including a wheelchair)?: A Lot Help needed walking in hospital room?: A Little Help needed climbing 3-5 steps with a railing? : A Lot 6 Click Score: 10    End of Session Equipment Utilized During Treatment: Gait belt Activity Tolerance: Patient tolerated treatment well;Patient limited by pain Patient left: in chair;with call bell/phone within reach Nurse Communication: Mobility status PT Visit Diagnosis: Other abnormalities of gait and mobility (R26.89);Muscle weakness (generalized) (M62.81);Pain Pain - Right/Left: Left Pain - part of body: Knee     Time: [id number] PT Time Calculation (min) (ACUTE ONLY): 28 min  Charges:  $Gait Training: 8-22 mins $Therapeutic Activity: 8-22 mins                    G Codes:       Joycelyn RuaAimee Ellarose Brandi, PTA pager [phone]    Florestine AversAimee J Kila Godina 03/05/2017, 5:08 PM

## 2017-03-05 NOTE — Social Work (Signed)
CSW spoke with patient and she has Declined SNF placement.  CSW advised RNCM who will intervene for home services.  CSW will sign off for now as social work intervention is no longer needed. Please consult us again if new need arises.  Keene BreathPatricia Tametria Aho, LCSW Clinical Social Worker [phone]

## 2017-03-05 NOTE — Progress Notes (Signed)
Subjective: 3 Days Post-Op Procedure(s) (LRB): TOTAL KNEE ARTHROPLASTY (Left)  Activity level:  OOB to chair with assist Diet tolerance:  regular Voiding:  well Patient reports pain as moderate.    Objective: Vital signs in last 24 hours: Temp:  [97.8 F (36.6 C)-99.5 F (37.5 C)] 98.4 F (36.9 C) (06/29 0617) Pulse Rate:  [70-78] 70 (06/29 0617) Resp:  [17-18] 17 (06/29 0617) BP: (169-199)/(60-77) 169/60 (06/29 0617) SpO2:  [94 %-98 %] 95 % (06/29 0617)  Labs:  Recent Labs  03/03/17 0550 03/04/17 0555  HGB 9.4* 9.5*    Recent Labs  03/03/17 0550 03/04/17 0555  WBC 6.5 6.2  RBC 3.28* 3.30*  HCT 30.3* 29.9*  PLT 185 189    Recent Labs  03/03/17 0550  NA 137  K 4.1  CL 107  CO2 25  BUN 24*  CREATININE 0.96  GLUCOSE 106*  CALCIUM 8.7*   No results for input(s): LABPT, INR in the last 72 hours.  Physical Exam:  Neurologically intact ABD soft Intact pulses distally No cellulitis present Compartment soft  Assessment/Plan:  3 Days Post-Op Procedure(s) (LRB): TOTAL KNEE ARTHROPLASTY (Left) Up with therapy again today Really wants to go home and has husband and grandson who can help Giving her one more inpatient day with PT in hopes that she will be safe for DC home tomorrow    Stacy Stanley 03/05/2017, 8:04 AM

## 2017-03-05 NOTE — Progress Notes (Addendum)
Physical Therapy Treatment Patient Details Name: Stacy HolsterLinda Stanley MRN: [id number] DOB: 09/17/1940 Today's Date: 03/05/2017    History of Present Illness is an 76 y/o female with pmh significant for bil TKR x2, TSA, significant hearing loss, Lupus, osteoporosis admitted for Revision of R TKA.    PT Comments    Pt requiring MOD A with transfers and with gait initially.  She was able to progress to MIN A with constant cues for technique and sequencing for 26'.  PT continues to recommend SNF, but pt is declining SNF, therefore recommend HHPT. Pt will need stair training prior to d/c.  She has 1 step to enter backdoor which she can access.  Pt reports she will have family assistance, including her grandson who is studying physical therapy.   Follow Up Recommendations  Supervision/Assistance - 24 hour;SNF (pt refusing, so recommend HHPT)     Equipment Recommendations  None recommended by PT    Recommendations for Other Services       Precautions / Restrictions Precautions Precautions: Knee;Fall Required Braces or Orthoses: Knee Immobilizer - Left Knee Immobilizer - Left: On when out of bed or walking Restrictions LLE Weight Bearing: Weight bearing as tolerated    Mobility  Bed Mobility Overal bed mobility: Needs Assistance Bed Mobility: Supine to Sit     Supine to sit: Min guard;Min assist     General bed mobility comments: Pt able to get close to EOB with min/guard with heavy use of rail, and then MIN A to scoot to EOB.  Transfers Overall transfer level: Needs assistance Equipment used: Rolling walker (2 wheeled) Transfers: Sit to/from Stand Sit to Stand: Mod assist         General transfer comment: Heavy MOD A to get fully upright  Ambulation/Gait Ambulation/Gait assistance: Min assist;Mod assist Ambulation Distance (Feet): 26 Feet Assistive device: Rolling walker (2 wheeled) Gait Pattern/deviations: Step-to pattern;Decreased weight shift to left;Decreased step length  - right     General Gait Details: For first 5' pt needed MOD A for weight shift and cues for sequencing.  Then pt began a smoother cadence and improved weight shift with MIN  A and recliner follow.   Stairs            Wheelchair Mobility    Modified Rankin (Stroke Patients Only)       Balance Overall balance assessment: Needs assistance Sitting-balance support: Feet supported;No upper extremity supported Sitting balance-Leahy Scale: Good     Standing balance support: Bilateral upper extremity supported Standing balance-Leahy Scale: Poor Standing balance comment: heavy use of RW                            Cognition Arousal/Alertness: Awake/alert Behavior During Therapy: WFL for tasks assessed/performed Overall Cognitive Status: Within Functional Limits for tasks assessed                                        Exercises Total Joint Exercises Ankle Circles/Pumps: AROM;Both;10 reps;Supine Quad Sets: Strengthening;Both;10 reps;Supine Towel Squeeze: Strengthening;Both;10 reps;Supine Short Arc Quad: Strengthening;Left;5 reps;Supine Heel Slides: AROM;AAROM;Left;10 reps;Supine Hip ABduction/ADduction: AAROM;Left;10 reps;Supine Goniometric ROM: grossly 50- 60 degrees throughout heel slides    General Comments        Pertinent Vitals/Pain Pain Assessment: 0-10 Pain Score: 8  Pain Location: left knee Pain Descriptors / Indicators: Aching;Sore;Operative site guarding Pain Intervention(s): Limited activity within  patient's tolerance;Monitored during session;RN gave pain meds during session    Home Living                      Prior Function            PT Goals (current goals can now be found in the care plan section) Acute Rehab PT Goals Patient Stated Goal: to go home PT Goal Formulation: With patient Time For Goal Achievement: 03/09/17 Potential to Achieve Goals: Good Progress towards PT goals: Progressing toward goals     Frequency    7X/week      PT Plan Current plan remains appropriate;Other (comment) (pt refusing SNF, but still recommend)    Co-evaluation              AM-PAC PT "6 Clicks" Daily Activity  Outcome Measure  Difficulty turning over in bed (including adjusting bedclothes, sheets and blankets)?: Total Difficulty moving from lying on back to sitting on the side of the bed? : Total Difficulty sitting down on and standing up from a chair with arms (e.g., wheelchair, bedside commode, etc,.)?: Total Help needed moving to and from a bed to chair (including a wheelchair)?: A Lot Help needed walking in hospital room?: A Lot Help needed climbing 3-5 steps with a railing? : A Lot 6 Click Score: 9    End of Session Equipment Utilized During Treatment: Gait belt Activity Tolerance: Patient tolerated treatment well;Patient limited by pain Patient left: in chair;with call bell/phone within reach Nurse Communication: Mobility status PT Visit Diagnosis: Other abnormalities of gait and mobility (R26.89);Muscle weakness (generalized) (M62.81);Pain Pain - Right/Left: Left Pain - part of body: Knee     Time: 1610-9604 PT Time Calculation (min) (ACUTE ONLY): 45 min  Charges:  $Gait Training: 8-22 mins $Therapeutic Exercise: 8-22 mins $Therapeutic Activity: 8-22 mins                    G Codes:       Stacy Stanley L. Katrinka Blazing, [name] Pager [phone] 03/05/2017    [name] 03/05/2017, 11:03 AM

## 2017-03-05 NOTE — Care Management Important Message (Signed)
Important Message  Patient Details  Name: Stacy HolsterLinda Stanley MRN: [id number] Date of Birth: 03/05/1941   Medicare Important Message Given:  Yes    Dorena BodoIris Ananias Kolander 03/05/2017, 12:23 PM

## 2017-03-06 MED ORDER — ASPIRIN 325 MG PO TBEC: 325.0000 mg | DELAYED_RELEASE_TABLET | Freq: Two times a day (BID) | ORAL | 0 refills | Status: DC

## 2017-03-06 MED ORDER — HYDROCODONE-ACETAMINOPHEN 5-325 MG PO TABS: 1.0000 | ORAL_TABLET | ORAL | 0 refills | Status: DC | PRN

## 2017-03-06 NOTE — Progress Notes (Signed)
Occupational Therapy Treatment Patient Details Name: Stacy Stanley MRN: [id number] DOB: [birth year] Today's Date: 03/06/2017    History of present illness is an 76 y/o female with pmh significant for bil TKR x2, TSA, significant hearing loss, Lupus, osteoporosis admitted for Revision of R TKA.   OT comments  Pt. Making gains with skilled OT.  Able to complete bed mobility and amb. To/from b.room for toileting and grooming tasks.  Will continue to follow acutely.    Follow Up Recommendations  SNF;Supervision/Assistance - 24 hour    Equipment Recommendations  None recommended by OT    Recommendations for Other Services      Precautions / Restrictions Precautions Precautions: Knee;Fall Required Braces or Orthoses: Knee Immobilizer - Left Knee Immobilizer - Left: On when out of bed or walking Restrictions RLE Weight Bearing: Weight bearing as tolerated LLE Weight Bearing: Weight bearing as tolerated       Mobility Bed Mobility Overal bed mobility: Needs Assistance Bed Mobility: Supine to Sit     Supine to sit: Min assist     General bed mobility comments: HOB slightly elevated as she reports having a bed with these functions at home.  no rails, exited on L side.  pt. able to prop on elbows and bring BLES oob without assistance but did require intermittent trunk support while she was guiding BLEs oob.  stated "it is good for me to do this, i mean i really want to do it but you have not met my husband he is so over protective and would never let me do this on my own"  Transfers Overall transfer level: Needs assistance Equipment used: Rolling walker (2 wheeled) Transfers: Sit to/from [facility] Sit to Stand: Mod assist Stand pivot transfers: Min assist       General transfer comment: Cues for hand placement to and from seated surface.  Assist for forwadr weight shifting and to boost into standing.      Balance                                            ADL either performed or assessed with clinical judgement   ADL Overall ADL's : Needs assistance/impaired     Grooming: Wash/dry hands;Min guard;Standing                   Toilet Transfer: Minimal assistance;Ambulation;RW;BSC;Grab bars;Comfort height toilet Toilet Transfer Details (indicate cue type and reason): 3n1 over the commode Toileting- Clothing Manipulation and Hygiene: Maximal assistance;Sit to/from stand Toileting - Clothing Manipulation Details (indicate cue type and reason): pt. was incontinent of BM while ambulating to b.room so there was additional clean up required for the backs of her thighs and shoes Tub/ Shower Transfer: Minimal assistance;Ambulation   Functional mobility during ADLs: Minimal assistance;Rolling walker       Vision       Perception     Praxis      Cognition Arousal/Alertness: Awake/alert Behavior During Therapy: WFL for tasks assessed/performed Overall Cognitive Status: Within Functional Limits for tasks assessed                                          Exercises     Shoulder Instructions       General Comments  Pertinent Vitals/ Pain       Pain Assessment: No/denies pain  Home Living                                          Prior Functioning/Environment              Frequency  Min 2X/week        Progress Toward Goals  OT Goals(current goals can now be found in the care plan section)  Progress towards OT goals: Progressing toward goals     Plan Discharge plan remains appropriate    Co-evaluation                 AM-PAC PT "6 Clicks" Daily Activity     Outcome Measure   Help from another person eating meals?: None Help from another person taking care of personal grooming?: A Little Help from another person toileting, which includes using toliet, bedpan, or urinal?: A Lot Help from another person bathing (including washing, rinsing, drying)?:  A Lot Help from another person to put on and taking off regular upper body clothing?: A Little Help from another person to put on and taking off regular lower body clothing?: A Lot 6 Click Score: 16    End of Session Equipment Utilized During Treatment: Gait belt;Rolling walker;Left knee immobilizer  OT Visit Diagnosis: Unsteadiness on feet (R26.81);Pain Pain - Right/Left: Left Pain - part of body: Knee   Activity Tolerance Patient tolerated treatment well   Patient Left in chair;with call bell/phone within reach   Nurse Communication Other (comment) (pt. requests a new KI due to urine contamintation and also needed external catheter reinserted)        Time: 7225-7505 OT Time Calculation (min): 42 min  Charges: OT General Charges $OT Visit: 1 Procedure OT Treatments $Self Care/Home Management : 38-52 mins   Janice Coffin, COTA/L 03/06/2017, 9:33 AM

## 2017-03-06 NOTE — Progress Notes (Signed)
PATIENT ID: Stacy HolsterLinda Lukens  MRN: [id number]  DOB/AGE:  01/16/1941 / 76 y.o.  4 Days Post-Op Procedure(s) (LRB): TOTAL KNEE ARTHROPLASTY (Left)    PROGRESS NOTE Subjective: Patient is alert, oriented, no Nausea, no Vomiting, yes passing gas. Taking PO well. Denies SOB, Chest or Calf Pain. Using Incentive Spirometer, PAS in place. Ambulate WBAT with pt walking 50 ft with therapy, Patient reports pain as moderate .    Objective: Vital signs in last 24 hours: Vitals:   03/05/17 0617 03/05/17 1430 03/05/17 2051 03/06/17 0535  BP: (!) 169/60 (!) 158/75 (!) 167/50 (!) 138/50  Pulse: 70  69 65  Resp: 17 16 17 17   Temp: 98.4 F (36.9 C) 98.3 F (36.8 C) 98.4 F (36.9 C) 98.8 F (37.1 C)  TempSrc: Oral  Oral Oral  SpO2: 95% 95% 94% 94%      Intake/Output from previous day: I/O last 3 completed shifts: In: 820 [P.O.:820] Out: 1000 [Urine:1000]   Intake/Output this shift: No intake/output data recorded.   LABORATORY DATA:  Recent Labs  03/04/17 0555 03/05/17 0632  WBC 6.2 7.3  HGB 9.5* 9.5*  HCT 29.9* 30.2*  PLT 189 214    Examination: Neurologically intact Neurovascular intact Sensation intact distally Intact pulses distally Dorsiflexion/Plantar flexion intact Incision: dressing C/D/I No cellulitis present Compartment soft}  Assessment:   4 Days Post-Op Procedure(s) (LRB): TOTAL KNEE ARTHROPLASTY (Left) ADDITIONAL DIAGNOSIS: Expected Acute Blood Loss Anemia, Hypertension and Anxiety, GERD, depression, peripheral neuropathy  Plan: PT/OT WBAT, AROM and PROM  DVT Prophylaxis:  SCDx72hrs, ASA 325 mg BID x 2 weeks DISCHARGE PLAN: Home, when pt passes therapy goals DISCHARGE NEEDS: HHPT, Walker and 3-in-1 comode seat     PHILLIPS, ERIC R 03/06/2017, 7:53 AM

## 2017-03-06 NOTE — Discharge Instructions (Signed)

## 2017-03-06 NOTE — Progress Notes (Signed)
Physical Therapy Treatment Patient Details Name: Stacy HolsterLinda Dunsmore MRN: [id number] DOB: 04/12/1941 Today's Date: 03/06/2017    History of Present Illness is an 76 y/o female with pmh significant for bil TKR x2, TSA, significant hearing loss, Lupus, osteoporosis admitted for Revision of R TKA.    PT Comments    Pt performed stair training in prep for d/c home.  Pt remains to require min to mod assist for mobility.  PTA educated family on use of gait belt at home to assist patient.  Pt remains motivated and continues to progress at a slower pace.  Will f/u in pm to review stair training and HEP.     Follow Up Recommendations  Supervision/Assistance - 24 hour;SNF (pt remains to refuse, so patient will require HHPT.  )     Equipment Recommendations  Rolling walker with 5" wheels (youth height)    Recommendations for Other Services       Precautions / Restrictions Precautions Precautions: Knee;Fall Required Braces or Orthoses: Knee Immobilizer - Left Knee Immobilizer - Left: On when out of bed or walking Restrictions Weight Bearing Restrictions: Yes RLE Weight Bearing: Weight bearing as tolerated LLE Weight Bearing: Weight bearing as tolerated    Mobility  Bed Mobility Overal bed mobility: Needs Assistance Bed Mobility: Supine to Sit     Supine to sit: Min assist     General bed mobility comments: Pt in recliner on arrival.    Transfers Overall transfer level: Needs assistance Equipment used: Rolling walker (2 wheeled) Transfers: Sit to/from [employer]Stand;Stand Pivot Transfers Sit to Stand: Mod assist Stand pivot transfers: Min assist       General transfer comment: Cues for hand placement to and from seated surface.  Assist for forward weight shifting and to boost into standing.    Ambulation/Gait Ambulation/Gait assistance: Min assist Ambulation Distance (Feet): 80 Feet Assistive device: Rolling walker (2 wheeled) Gait Pattern/deviations: Step-to pattern;Step-through  pattern;Decreased stride length;Shuffle;Trunk flexed   Gait velocity interpretation: Below normal speed for age/gender General Gait Details: Pt required 2 standing rest breaks during gait training.  Pt required cues for sequencing, L heel strike and L quad control due to reduce buckling.  Buckling noted x2.  Pt fatigues during gait training.     Stairs Stairs: Yes   Stair Management: Two rails;One rail Left;Sideways;Forwards Number of Stairs: 6 General stair comments: x3 with B rails and forward negotiation.  x3 sideways with L rail.  Pt required cues for sequencing and foot placement for safety.  Increased time required to complete.    Wheelchair Mobility    Modified Rankin (Stroke Patients Only)       Balance Overall balance assessment: Needs assistance   Sitting balance-Leahy Scale: Good       Standing balance-Leahy Scale: Poor Standing balance comment: heavy use of RW                            Cognition Arousal/Alertness: Awake/alert Behavior During Therapy: WFL for tasks assessed/performed Overall Cognitive Status: Within Functional Limits for tasks assessed                                        Exercises      General Comments        Pertinent Vitals/Pain Pain Assessment: No/denies pain Pain Score: 8  Faces Pain Scale: Hurts whole lot Pain Location: left  knee Pain Intervention(s): Monitored during session;Repositioned    Home Living                      Prior Function            PT Goals (current goals can now be found in the care plan section) Acute Rehab PT Goals Patient Stated Goal: to go home Potential to Achieve Goals: Good Progress towards PT goals: Progressing toward goals    Frequency    7X/week      PT Plan Current plan remains appropriate    Co-evaluation              AM-PAC PT "6 Clicks" Daily Activity  Outcome Measure  Difficulty turning over in bed (including adjusting  bedclothes, sheets and blankets)?: Total Difficulty moving from lying on back to sitting on the side of the bed? : Total Difficulty sitting down on and standing up from a chair with arms (e.g., wheelchair, bedside commode, etc,.)?: Total Help needed moving to and from a bed to chair (including a wheelchair)?: A Lot Help needed walking in hospital room?: A Little Help needed climbing 3-5 steps with a railing? : A Lot 6 Click Score: 10    End of Session Equipment Utilized During Treatment: Gait belt Activity Tolerance: Patient tolerated treatment well;Patient limited by pain Patient left: in chair;with call bell/phone within reach Nurse Communication: Mobility status PT Visit Diagnosis: Other abnormalities of gait and mobility (R26.89);Muscle weakness (generalized) (M62.81);Pain Pain - Right/Left: Left Pain - part of body: Knee     Time: 1610-9604 PT Time Calculation (min) (ACUTE ONLY): 44 min  Charges:  $Gait Training: 23-37 mins $Therapeutic Activity: 8-22 mins                    G Codes:       Joycelyn Rua, PTA pager [phone]    Florestine Avers 03/06/2017, 1:18 PM

## 2017-03-06 NOTE — Progress Notes (Signed)
Physical Therapy Treatment Patient Details Name: Stacy Stanley MRN: [id number] DOB: [birth year] Today's Date: 03/06/2017    History of Present Illness is an 76 y/o female with pmh significant for bil TKR x2, TSA, significant hearing loss, Lupus, osteoporosis admitted for Revision of R TKA.    PT Comments    Pt performed increased therapeutic exercises in supine in bed.  Progressed OOB to chair for stair training in rehab gym.  Pt began to climb her second stair and experienced bowel incontinence.  Pt brought back to room and performed additional transfer to commode and then required lengthy standing trial for perianal care.  Pt will benefit from skilled rehab in a post acute setting but remains to refuse.  Her family reports they can provide assistance and have been present during therapy sessions for carryover at home.  At this time I will inform supervising PT of change in recommendations for home with HHPT.  Will f/u in am for continued mobility and review of stair training.     Follow Up Recommendations  Home health PT;Supervision/Assistance - 24 hour (refusing SNF. Pt will require adequate amount of assistance and spouse and grandson report understanding.  )     Equipment Recommendations  Rolling walker with 5" wheels (youth height)    Recommendations for Other Services       Precautions / Restrictions Precautions Precautions: Knee;Fall Required Braces or Orthoses: Knee Immobilizer - Left Knee Immobilizer - Left: On when out of bed or walking Restrictions Weight Bearing Restrictions: Yes LLE Weight Bearing: Weight bearing as tolerated    Mobility  Bed Mobility Overal bed mobility: Needs Assistance Bed Mobility: Supine to Sit Rolling: Min assist;Mod assist   Supine to sit: Mod assist     General bed mobility comments: Min assist to advance LEs to edge of bed and lift LE back into bed against gravity.  Mod assist for scooting with use of chux pad.    Transfers Overall  transfer level: Needs assistance Equipment used: Rolling walker (2 wheeled) Transfers: Sit to/from [employer] Sit to Stand: Mod assist;Min assist Stand pivot transfers: Min assist       General transfer comment: Cues for hand placement to and from seated surface.  Assist for forward weight shifting and to boost into standing.    Ambulation/Gait Ambulation/Gait assistance: Min assist Ambulation Distance (Feet):  (steps to and from commode and to and from chair.  Focused tx on exercises and stair negotiation.  ) Assistive device: Rolling walker (2 wheeled) Gait Pattern/deviations: Step-to pattern   Gait velocity interpretation: Below normal speed for age/gender General Gait Details: Steps to chair and commode.  Assist with turning, backing and RW safety   Stairs Stairs: Yes   Stair Management: One rail Left;Sideways Number of Stairs: 2 General stair comments: Cues for sequencing and hand placement when attempting to step to second stair, patient with bowel incontinence there fore further stair training declined.    Wheelchair Mobility    Modified Rankin (Stroke Patients Only)       Balance Overall balance assessment: Needs assistance   Sitting balance-Leahy Scale: Good       Standing balance-Leahy Scale: Poor Standing balance comment: heavy use of RW                            Cognition Arousal/Alertness: Awake/alert Behavior During Therapy: WFL for tasks assessed/performed Overall Cognitive Status: Within Functional Limits for tasks assessed  Exercises Total Joint Exercises Ankle Circles/Pumps: AROM;Both;10 reps;Supine Quad Sets: Strengthening;10 reps;Supine;Left Towel Squeeze: Strengthening;Both;10 reps;Supine Short Arc Quad: Strengthening;Left;Supine;10 reps Heel Slides: AROM;AAROM;Left;10 reps;Supine Hip ABduction/ADduction: Left;10 reps;Supine;AROM Straight Leg Raises:  AAROM;Strengthening;Left;10 reps;Supine Long Arc Quad: AAROM;Left;Seated;10 reps Goniometric ROM: grossly 70 degrees flexion did not measure due to bowel incontinence and patient fatigue and need for perianal care.      General Comments        Pertinent Vitals/Pain Pain Assessment: 0-10 Pain Score: 7  Faces Pain Scale: Hurts whole lot Pain Location: left knee Pain Descriptors / Indicators: Aching;Sore;Operative site guarding Pain Intervention(s): Monitored during session;Ice applied;Repositioned    Home Living                      Prior Function            PT Goals (current goals can now be found in the care plan section) Acute Rehab PT Goals Patient Stated Goal: to go home Potential to Achieve Goals: Good Progress towards PT goals: Progressing toward goals    Frequency    7X/week      PT Plan Current plan remains appropriate    Co-evaluation              AM-PAC PT "6 Clicks" Daily Activity  Outcome Measure  Difficulty turning over in bed (including adjusting bedclothes, sheets and blankets)?: Total Difficulty moving from lying on back to sitting on the side of the bed? : Total Difficulty sitting down on and standing up from a chair with arms (e.g., wheelchair, bedside commode, etc,.)?: Total Help needed moving to and from a bed to chair (including a wheelchair)?: A Lot Help needed walking in hospital room?: A Little Help needed climbing 3-5 steps with a railing? : A Lot 6 Click Score: 10    End of Session Equipment Utilized During Treatment: Gait belt Activity Tolerance: Patient tolerated treatment well;Patient limited by pain Patient left: in chair;with call bell/phone within reach Nurse Communication: Mobility status PT Visit Diagnosis: Other abnormalities of gait and mobility (R26.89);Muscle weakness (generalized) (M62.81);Pain Pain - Right/Left: Left Pain - part of body: Knee     Time: [id number] PT Time Calculation (min) (ACUTE ONLY):  49 min  Charges:  $Gait Training: 8-22 mins $Therapeutic Exercise: 8-22 mins $Therapeutic Activity: 8-22 mins                    G Codes:       Joycelyn RuaAimee Kaylen Nghiem, PTA pager [phone]    Florestine AversAimee J Alvon Nygaard 03/06/2017, 3:21 PM

## 2017-03-07 NOTE — Progress Notes (Signed)
[facility], [facility] order for today. HH referral initiated by ortho office. Patient has DME. Papers printed for PTAR and placed on chart for nurse to call [phone] when RN patient and family ready. Patient will DC today.

## 2017-03-07 NOTE — Progress Notes (Signed)
Patient discharged per orders. Prescriptions given to patient's husband per patient request. Medications, prescriptions, follow up appointments, wound care, home care, therapy, activity restrictions discussed. Patient stated she needed a new knee immobilizer because hers had become soiled with urine. Order for replacement immobilizer placed and ortho tech paged.  Patient will be discharged home. Patient refusing transport home with PTAR. Pt's husband will transport home via personal vehicle. Patient's grandson also at the bedside. Will continue to monitor until time of discharge.  Asher Muir- Yonatan Guitron,RN

## 2017-03-07 NOTE — Progress Notes (Signed)
Physical Therapy Treatment Patient Details Name: Stacy Stanley MRN: [id number] DOB: 10/16/1940 Today's Date: 03/07/2017    History of Present Illness is an 76 y/o female with pmh significant for bil TKR x2, TSA, significant hearing loss, Lupus, osteoporosis admitted for Revision of R TKA.    PT Comments    Pt performed increased gait and functional mobility.  Transfer from bed performed with improved ease.  Pt required cues to maintain focus on task.  Informed nursing that patient is ready to d/c from a mobility standpoint.    Follow Up Recommendations  Home health PT;Supervision/Assistance - 24 hour     Equipment Recommendations  Rolling walker with 5" wheels (youth height)    Recommendations for Other Services       Precautions / Restrictions Precautions Precautions: Knee;Fall Required Braces or Orthoses: Knee Immobilizer - Left Knee Immobilizer - Left: On when out of bed or walking Restrictions Weight Bearing Restrictions: Yes LLE Weight Bearing: Weight bearing as tolerated    Mobility  Bed Mobility Overal bed mobility: Needs Assistance Bed Mobility: Supine to Sit     Supine to sit: Mod assist     General bed mobility comments: Cues for hand placement and assist with scooting hips and B LEs to edge of bed.  Use of chux pad to scoot hips.    Transfers Overall transfer level: Needs assistance Equipment used: Rolling walker (2 wheeled) Transfers: Sit to/from [employer];Stand Pivot Transfers Sit to Stand: Mod assist;Min assist (assist level varried.  )         General transfer comment: Cues for hand placement to and from seated surface.  Assist for forward weight shifting and to boost into standing.    Ambulation/Gait Ambulation/Gait assistance: Min assist;Min guard Ambulation Distance (Feet): 300 Feet Assistive device: Rolling walker (2 wheeled) Gait Pattern/deviations: Step-to pattern;Step-through pattern;Trunk flexed;Shuffle;Decreased stride length   Gait  velocity interpretation: Below normal speed for age/gender General Gait Details: Cues for sequencing and upper trunk control. Pt with improved endurance and gait distance.  Pt with buckling x3 during gait but able to correct with min assist.     Stairs            Wheelchair Mobility    Modified Rankin (Stroke Patients Only)       Balance Overall balance assessment: Needs assistance Sitting-balance support: Feet supported;No upper extremity supported Sitting balance-Leahy Scale: Good       Standing balance-Leahy Scale: Poor Standing balance comment: heavy use of RW                            Cognition Arousal/Alertness: Awake/alert Behavior During Therapy: WFL for tasks assessed/performed Overall Cognitive Status: Within Functional Limits for tasks assessed                                        Exercises      General Comments        Pertinent Vitals/Pain Pain Assessment: 0-10 Faces Pain Scale: Hurts whole lot Pain Location: left knee Pain Descriptors / Indicators: Aching;Sore;Operative site guarding Pain Intervention(s): Monitored during session;Repositioned    Home Living                      Prior Function            PT Goals (current goals can now be found in  the care plan section) Acute Rehab PT Goals Patient Stated Goal: to go home Potential to Achieve Goals: Good Progress towards PT goals: Progressing toward goals    Frequency    7X/week      PT Plan Current plan remains appropriate    Co-evaluation              AM-PAC PT "6 Clicks" Daily Activity  Outcome Measure  Difficulty turning over in bed (including adjusting bedclothes, sheets and blankets)?: Total Difficulty moving from lying on back to sitting on the side of the bed? : Total Difficulty sitting down on and standing up from a chair with arms (e.g., wheelchair, bedside commode, etc,.)?: Total Help needed moving to and from a bed to  chair (including a wheelchair)?: A Lot Help needed walking in hospital room?: A Little Help needed climbing 3-5 steps with a railing? : A Lot 6 Click Score: 10    End of Session Equipment Utilized During Treatment: Gait belt Activity Tolerance: Patient tolerated treatment well;Patient limited by pain Patient left: in chair;with call bell/phone within reach Nurse Communication: Mobility status PT Visit Diagnosis: Other abnormalities of gait and mobility (R26.89);Muscle weakness (generalized) (M62.81);Pain Pain - Right/Left: Left Pain - part of body: Knee     Time: 1610-9604 PT Time Calculation (min) (ACUTE ONLY): 39 min  Charges:  $Gait Training: 23-37 mins $Therapeutic Activity: 8-22 mins                    G Codes:       Stacy Stanley, PTA pager [phone]    Stacy Stanley 03/07/2017, 11:04 AM

## 2017-03-07 NOTE — Discharge Summary (Signed)
Patient ID: Stacy Stanley MRN: [id number] DOB/AGE: Jun 19, 1941 76 y.o.  Admit date: 03/02/2017 Discharge date: 03/07/2017  Admission Diagnoses:  Active Problems:   S/P TKR (total knee replacement)   Discharge Diagnoses:  Same  Past Medical History:  Diagnosis Date  . Anemia   . Anxiety   . Arthritis   . Complication of anesthesia   . Depression   . Difficult intubation    fiberoptic intubation in the setting of C2 fx 08/29/12; now s/p C1-2 and T2-8 fusion (has limited neck ROM)  . GERD (gastroesophageal reflux disease)   . Headache   . High cholesterol    controlled with Atorvastatin  . History of bronchitis   . History of pneumonia   . Hypertension   . Hyponatremia    states that sodium level drops after surgery  . Hypothyroidism   . Numbness and tingling    hands and feet  . Peripheral neuropathy   . Restless leg syndrome   . Urinary incontinence   . Urinary urgency     Surgeries: Procedure(s): TOTAL KNEE ARTHROPLASTY on 03/02/2017   Consultants:   Discharged Condition: Improved  Hospital Course: Stacy Stanley is an 76 y.o. female who was admitted 03/02/2017 for operative treatment of<principal problem not specified>. Patient has severe unremitting pain that affects sleep, daily activities, and work/hobbies. After pre-op clearance the patient was taken to the operating room on 03/02/2017 and underwent  Procedure(s): TOTAL KNEE ARTHROPLASTY.    Patient was given perioperative antibiotics: Anti-infectives    Start     Dose/Rate Route Frequency Ordered Stop   03/02/17 1400  ceFAZolin (ANCEF) IVPB 2g/100 mL premix     2 g 200 mL/hr over 30 Minutes Intravenous Every 6 hours 03/02/17 1235 03/02/17 2110   03/02/17 0700  ceFAZolin (ANCEF) IVPB 2g/100 mL premix     2 g 200 mL/hr over 30 Minutes Intravenous To [facility] 03/01/17 1109 03/02/17 0757       Patient was given sequential compression devices, early ambulation, and chemoprophylaxis to prevent  DVT.  Patient benefited maximally from hospital stay and there were no complications.    Recent vital signs: Patient Vitals for the past 24 hrs:  BP Temp Temp src Pulse Resp SpO2  03/07/17 0621 (!) 176/71 98.3 F (36.8 C) Oral 70 16 96 %  03/06/17 2203 (!) 150/59 98 F (36.7 C) Oral 72 16 95 %  03/06/17 1827 (!) 164/64 98 F (36.7 C) Oral 71 16 94 %     Recent laboratory studies:  Recent Labs  03/05/17 0632  WBC 7.3  HGB 9.5*  HCT 30.2*  PLT 214     Discharge Medications:   Allergies as of 03/07/2017      Reactions   Adhesive [tape] Itching, Rash      Medication List    STOP taking these medications   acetaminophen 500 MG tablet Commonly known as:  TYLENOL     TAKE these medications   aspirin 325 MG EC tablet Take 1 tablet (325 mg total) by mouth 2 (two) times daily after a meal.   atorvastatin 20 MG tablet Commonly known as:  LIPITOR Take 20 mg by mouth daily with supper.   baclofen 10 MG tablet Commonly known as:  LIORESAL Take 10 mg by mouth 4 (four) times daily as needed for muscle spasms.   busPIRone 10 MG tablet Commonly known as:  BUSPAR Take 10 mg by mouth 3 (three) times daily.   celecoxib 200 MG capsule Commonly known  as:  CELEBREX Take 200 mg by mouth daily.   docusate sodium 100 MG capsule Commonly known as:  COLACE Take 200 mg by mouth daily.   ferrous sulfate 325 (65 FE) MG EC tablet Take 325 mg by mouth daily.   Fish Oil 1000 MG Caps Take 1,000 mg by mouth daily.   GLUCOSAMINE CHONDR 1500 COMPLX Caps Take 1 capsule by mouth daily.   HAIR/SKIN/NAILS Tabs Take 1 tablet by mouth daily.   hydrALAZINE 50 MG tablet Commonly known as:  APRESOLINE Take 50 mg by mouth 3 (three) times daily.   HYDROcodone-acetaminophen 5-325 MG tablet Commonly known as:  NORCO/VICODIN Take 1 tablet by mouth every 4 (four) hours as needed for moderate pain. What changed:  when to take this  reasons to take this   labetalol 300 MG  tablet Commonly known as:  NORMODYNE Take 300 mg by mouth 3 (three) times daily.   levothyroxine 112 MCG tablet Commonly known as:  SYNTHROID, LEVOTHROID Take 112 mcg by mouth daily before breakfast.   loratadine 10 MG tablet Commonly known as:  CLARITIN Take 10 mg by mouth daily.   losartan 100 MG tablet Commonly known as:  COZAAR Take 100 mg by mouth daily.   montelukast 10 MG tablet Commonly known as:  SINGULAIR Take 10 mg by mouth at bedtime.   multivitamin with minerals Tabs tablet Take 1 tablet by mouth daily.   omeprazole 40 MG capsule Commonly known as:  PRILOSEC Take 40 mg by mouth daily.   pregabalin 200 MG capsule Commonly known as:  LYRICA Take 200-400 mg by mouth 2 (two) times daily. Take 200mg  in the morning and 400mg s at night   rOPINIRole 0.25 MG tablet Commonly known as:  REQUIP Take 0.25 mg by mouth 3 (three) times daily. Take 0.25mg s at 3pm, 0.25mg s at dinner, and 0.5mg s at bedtime   tolterodine 4 MG 24 hr capsule Commonly known as:  DETROL LA Take 4 mg by mouth daily with supper.   topiramate 100 MG tablet Commonly known as:  TOPAMAX Take 100 mg by mouth at bedtime.   traZODone 50 MG tablet Commonly known as:  DESYREL Take 50 mg by mouth at bedtime.   venlafaxine XR 75 MG 24 hr capsule Commonly known as:  EFFEXOR-XR Take 75 mg by mouth 2 (two) times daily.   Vitamin D3 2000 units capsule Take 2,000 Units by mouth daily.            Durable Medical Equipment        Start     Ordered   03/02/17 1236  DME Walker rolling  Once    Question:  Patient needs a walker to treat with the following condition  Answer:  10 minute Apgar score 0   03/02/17 1235   03/02/17 1236  DME 3 n 1  Once     03/02/17 1235   03/02/17 1236  DME Bedside commode  Once    Question:  Patient needs a bedside commode to treat with the following condition  Answer:  10 minute Apgar score 0   03/02/17 1235      Diagnostic Studies: No results  found.  Disposition: 03-Skilled Nursing Facility  Discharge Instructions    Call MD / Call 911    Complete by:  As directed    If you experience chest pain or shortness of breath, CALL 911 and be transported to the hospital emergency room.  If you develope a fever above 101 F, pus (white  drainage) or increased drainage or redness at the wound, or calf pain, call your surgeon's office.   Constipation Prevention    Complete by:  As directed    Drink plenty of fluids.  Prune juice may be helpful.  You may use a stool softener, such as Colace (over the counter) 100 mg twice a day.  Use MiraLax (over the counter) for constipation as needed.   Diet - low sodium heart healthy    Complete by:  As directed    Driving restrictions    Complete by:  As directed    No driving for 2 weeks   Increase activity slowly as tolerated    Complete by:  As directed    Patient may shower    Complete by:  As directed    You may shower without a dressing once there is no drainage.  Do not wash over the wound.  If drainage remains, cover wound with plastic wrap and then shower.      Follow-up Information    [facility] Follow up.   Why:  A representative from [facility] will contact you to arrange start date and time for your therapy. Contact information: [address] [place] [address] [phone]        Marcene Corningalldorf, Peter, MD Follow up in 10 day(s).   Specialty:  Orthopedic Surgery Contact information: [address] [place] [address] [phone]            Signed: Rhayne Chatwin R 03/07/2017, 7:54 AM

## 2017-03-07 NOTE — Progress Notes (Signed)
Orthopedic Tech Progress Note Patient Details:  Stacy HolsterLinda Stanley 01/06/1941 [id number]  Ortho Devices Type of Ortho Device: Knee Immobilizer Ortho Device/Splint Interventions: Application   Saul FordyceJennifer C Nyja Westbrook 03/07/2017, 12:16 PM

## 2017-03-07 NOTE — Progress Notes (Signed)
PATIENT ID: Stacy HolsterLinda Stanley  MRN: [id number]  DOB/AGE:  12/08/1940 / 76 y.o.  5 Days Post-Op Procedure(s) (LRB): TOTAL KNEE ARTHROPLASTY (Left)    PROGRESS NOTE Subjective: Patient is alert, oriented, no Nausea, no Vomiting, yes passing gas. Taking PO well. Denies SOB, Chest or Calf Pain. Using Incentive Spirometer, PAS in place. Ambulate WBAT with pt walking 80 ft, Patient reports pain as 6/10 .    Objective: Vital signs in last 24 hours: Vitals:   03/06/17 0535 03/06/17 1827 03/06/17 2203 03/07/17 0621  BP: (!) 138/50 (!) 164/64 (!) 150/59 (!) 176/71  Pulse: 65 71 72 70  Resp: 17 16 16 16   Temp: 98.8 F (37.1 C) 98 F (36.7 C) 98 F (36.7 C) 98.3 F (36.8 C)  TempSrc: Oral Oral Oral Oral  SpO2: 94% 94% 95% 96%      Intake/Output from previous day: I/O last 3 completed shifts: In: 480 [P.O.:480] Out: 1500 [Urine:1500]   Intake/Output this shift: No intake/output data recorded.   LABORATORY DATA:  Recent Labs  03/05/17 0632  WBC 7.3  HGB 9.5*  HCT 30.2*  PLT 214    Examination: Neurologically intact Neurovascular intact Sensation intact distally Intact pulses distally Dorsiflexion/Plantar flexion intact Incision: dressing C/D/I and scant drainage No cellulitis present Compartment soft}  Assessment:   5 Days Post-Op Procedure(s) (LRB): TOTAL KNEE ARTHROPLASTY (Left) ADDITIONAL DIAGNOSIS: Expected Acute Blood Loss Anemia, Hypertension and Anxiety, GERD, depression, peripheral neuropathy    Plan: PT/OT WBAT, AROM and PROM  DVT Prophylaxis:  SCDx72hrs, ASA 325 mg BID x 2 weeks DISCHARGE PLAN: Home, when pt passes therapy goals, likely today DISCHARGE NEEDS: HHPT, Walker and 3-in-1 comode seat     Jusiah Aguayo R 03/07/2017, 7:51 AM

## 2017-07-05 ENCOUNTER — Telehealth: Payer: Self-pay | Admitting: Neurology

## 2017-07-05 ENCOUNTER — Ambulatory Visit: Payer: Medicare Other | Admitting: Neurology

## 2017-07-05 NOTE — Telephone Encounter (Signed)
Pt called said when appt was confirmed with husband on 10/25 he was told she was being seen to get pain medications. I advised the pt that there are not any notes that we can see to determine what she was to be seen for and that there must be a misunderstanding. I also advised her that our providers do not give pain medication but again it is not documented what she was coming for. Pt said she did not want to keep the appt and said to c/a it. She was appreciative.  FYI

## 2017-07-05 NOTE — Telephone Encounter (Signed)
Noted/fim 

## 2017-09-24 ENCOUNTER — Other Ambulatory Visit: Payer: Self-pay | Admitting: Orthopaedic Surgery

## 2017-09-28 NOTE — Pre-Procedure Instructions (Signed)
Stacy HolsterLinda Stanley  09/28/2017      [id number] - [place], [place] - [address]OSS [address] [place] [address] Phone: [phone] Fax: [phone]    Your procedure is scheduled on January 29  [facility] Ad[facility] at 1330 P.M.  Call this number if you have problems the morning of surgery:  [phone]   Remember:  Do not eat food or drink liquids after midnight.  Continue all medications as directed by your physician except follow these medication instructions before surgery below   Take these medicines the morning of surgery with A SIP OF WATER  baclofen (LIORESAL) busPIRone (BUSPAR) hydrALAZINE (APRESOLINE) HYDROcodone-acetaminophen (NORCO/VICODIN) labetalol (NORMODYNE) levothyroxine (SYNTHROID, LEVOTHROID) omeprazole (PRILOSEC)  pregabalin (LYRICA) traMADol (ULTRAM) if needed for pain venlafaxine XR (EFFEXOR-XR)   7 days prior to surgery STOP taking any Aspirin(unless otherwise instructed by your surgeon), Aleve, Naproxen, Ibuprofen, Motrin, Advil, Goody's, BC's, all herbal medications, fish oil, and all vitamins     Do not wear jewelry, make-up or nail polish.  Do not wear lotions, powders, or perfumes, or deodorant.  Do not shave 48 hours prior to surgery.    Do not bring valuables to the hospital.  [facility] is not responsible for any belongings or valuables.  Contacts, dentures or bridgework may not be worn into surgery.  Leave your suitcase in the car.  After surgery it may be brought to your room.  For patients admitted to the hospital, discharge time will be determined by your treatment team.  Patients discharged the day of surgery will not be allowed to drive home.    Special instructions:   [facility]- Preparing For Surgery  Before surgery, you can play an important role. Because skin is not sterile, your skin needs to be as free of germs as possible. You can reduce the  number of germs on your skin by washing with CHG (chlorahexidine gluconate) Soap before surgery.  CHG is an antiseptic cleaner which kills germs and bonds with the skin to continue killing germs even after washing.  Please do not use if you have an allergy to CHG or antibacterial soaps. If your skin becomes reddened/irritated stop using the CHG.  Do not shave (including legs and underarms) for at least 48 hours prior to first CHG shower. It is OK to shave your face.  Please follow these instructions carefully.   1. Shower the NIGHT BEFORE SURGERY and the MORNING OF SURGERY with CHG.   2. If you chose to wash your hair, wash your hair first as usual with your normal shampoo.  3. After you shampoo, rinse your hair and body thoroughly to remove the shampoo.  4. Use CHG as you would any other liquid soap. You can apply CHG directly to the skin and wash gently with a scrungie or a clean washcloth.   5. Apply the CHG Soap to your body ONLY FROM THE NECK DOWN.  Do not use on open wounds or open sores. Avoid contact with your eyes, ears, mouth and genitals (private parts). Wash Face and genitals (private parts)  with your normal soap.  6. Wash thoroughly, paying special attention to the area where your surgery will be performed.  7. Thoroughly rinse your body with warm water from the neck down.  8. DO NOT shower/wash with your normal soap after using and rinsing off the CHG Soap.  9. Pat yourself dry with a CLEAN TOWEL.  10. Wear CLEAN PAJAMAS to bed the night before surgery, wear comfortable clothes the morning of surgery  11. Place CLEAN SHEETS on your bed the night of your first shower and DO NOT SLEEP WITH PETS.    Day of Surgery: Do not apply any deodorants/lotions. Please wear clean clothes to the hospital/surgery center.      Please read over the following fact sheets that you were given.

## 2017-09-28 NOTE — Progress Notes (Addendum)
Anesthesia Chart Review: Patient is a 77 year old female scheduled for ligament repair left knee retinacular repair on 10/05/17 by Dr. Melrose Nakayama. Anesthesia type is posted for spinal.   History includes non-smoker, peripheral neuropathy, HTN, hypothyroidism, hypercholesterolemia, arthritis, RLS, urinary incontinence, anxiety, depression, anemia, fall down stairs 03/2012(comminuted C1 fracture, T5-6 compression fractures, T4-7 transverse fracture; s/p C1-2 fusion and T2-T8 spinal fusion 08/29/12), hysterectomy, tonsillectomy, left breast lumpectomy, Medtronic bladder stimulator (positioned near her "buttocks"), right TKA (adductor canal block, spinal, MAC) 08/04/16, left TKA (adductor canal block, LMA 4.0 X 2 attempts) 03/02/17. Reported issues with hyponatremia (with associated mental status changes) after 2013 surgery. BMI is consistent with obesity.   She had previously denied history of DIFFICULT AIRWAY, but fiberoptic intubation was used on 08/29/12 (but in the setting of known C2 fracture). Her neck now has limited ROM following C1-2, T2-8 fusion. She also has a small mouth and short neck. She has upper and lower partials. Based on her 07/29/16 exam findings, I think she would be an anticipated DIFFICULT AIRWAY.  08/29/12 Anesthesia Record [facility]; Care Everywhere): Stacy Pulley, MD 12/23/20138:41 AM AIRWAY PROCEDURAL NOTE Airway Type:Difficult Ability to Mask Ventilate Patient:Other LARYNGOSCOPY: Blade(s) Used: Other  Attempts: One ETT Size/Location: 7.0, Oral and Cuffed Secured with: Fabric Tape Secured at (cm): 23 End Tidal C02 is positive and breath sounds equal bilaterally  ENDOTBRONCHIAL TUBE: Difficult Airway Protocol Indications: Unstable Cervical Spine Fiberoptic Intubation:Required after induction   PCP is Dr. Blenda Mounts with [facility]; see Care Everywhere). Urologist is Dr. Rosario Adie. Neurologist is Dr. Creig Hines (for peripheral  neuropathy).  Meds include Lipitor, baclofen, Buspar, Celebrex, Vitamin D, 65 Fe, hydralazine, Norco, labetalol, levothyroxine, losartan, Singulair, fish oil, Prilosec, Lyrica, Requip, Detrol LA, tramadol, Topamax, trazodone, Effexor-XR.  BP (!) 149/62   Pulse 64   Temp 36.7 C   Resp 20   Ht _0  (1.499 m)   Wt 196 lb (88.9 kg)   SpO2 97%   BMI 39.59 kg/m   EKG 03/02/17: SR with first degree AV block.  Preoperative labs noted.  Previous airway issues discussed above. Cased is posted for spinal. Definitive anesthesia plan following anesthesiologist evaluation on the day of surgery.   George Hugh [facility] Short Stay Center/Anesthesiology Phone [phone] 09/30/2017 11:51 AM

## 2017-09-29 ENCOUNTER — Encounter (HOSPITAL_COMMUNITY)
Admission: RE | Admit: 2017-09-29 | Discharge: 2017-09-29 | Disposition: A | Discharge status: Home / Self Care | Payer: Medicare HMO | Source: Ambulatory Visit | Attending: Orthopaedic Surgery | Admitting: Orthopaedic Surgery

## 2017-09-29 ENCOUNTER — Other Ambulatory Visit: Payer: Self-pay

## 2017-09-29 ENCOUNTER — Encounter (HOSPITAL_COMMUNITY): Payer: Self-pay

## 2017-09-29 DIAGNOSIS — F329 Major depressive disorder, single episode, unspecified: Secondary | ICD-10-CM | POA: Insufficient documentation

## 2017-09-29 DIAGNOSIS — F419 Anxiety disorder, unspecified: Secondary | ICD-10-CM | POA: Diagnosis not present

## 2017-09-29 DIAGNOSIS — G2581 Restless legs syndrome: Secondary | ICD-10-CM | POA: Diagnosis not present

## 2017-09-29 DIAGNOSIS — Z79899 Other long term (current) drug therapy: Secondary | ICD-10-CM | POA: Diagnosis not present

## 2017-09-29 DIAGNOSIS — G629 Polyneuropathy, unspecified: Secondary | ICD-10-CM | POA: Diagnosis not present

## 2017-09-29 DIAGNOSIS — E039 Hypothyroidism, unspecified: Secondary | ICD-10-CM | POA: Diagnosis not present

## 2017-09-29 DIAGNOSIS — E7800 Pure hypercholesterolemia, unspecified: Secondary | ICD-10-CM | POA: Diagnosis not present

## 2017-09-29 DIAGNOSIS — Z79890 Hormone replacement therapy (postmenopausal): Secondary | ICD-10-CM | POA: Diagnosis not present

## 2017-09-29 DIAGNOSIS — Z981 Arthrodesis status: Secondary | ICD-10-CM | POA: Insufficient documentation

## 2017-09-29 DIAGNOSIS — I10 Essential (primary) hypertension: Secondary | ICD-10-CM | POA: Insufficient documentation

## 2017-09-29 DIAGNOSIS — Z01818 Encounter for other preprocedural examination: Secondary | ICD-10-CM | POA: Diagnosis not present

## 2017-09-29 DIAGNOSIS — M1990 Unspecified osteoarthritis, unspecified site: Secondary | ICD-10-CM | POA: Diagnosis not present

## 2017-09-29 DIAGNOSIS — Z01812 Encounter for preprocedural laboratory examination: Secondary | ICD-10-CM | POA: Diagnosis not present

## 2017-09-29 DIAGNOSIS — Z96651 Presence of right artificial knee joint: Secondary | ICD-10-CM | POA: Insufficient documentation

## 2017-09-29 LAB — BASIC METABOLIC PANEL
Anion gap: 16 — ABNORMAL HIGH (ref 5–15)
BUN: 35 mg/dL — AB (ref 6–20)
CHLORIDE: 108 mmol/L (ref 101–111)
CO2: 17 mmol/L — ABNORMAL LOW (ref 22–32)
CREATININE: 0.98 mg/dL (ref 0.44–1.00)
Calcium: 9.8 mg/dL (ref 8.9–10.3)
GFR calc Af Amer: >60 mL/min (ref >60)
GFR calc non Af Amer: 55 mL/min — ABNORMAL LOW (ref >60)
Glucose, Bld: 94 mg/dL (ref 65–99)
Potassium: 4.8 mmol/L (ref 3.5–5.1)
Sodium: 141 mmol/L (ref 135–145)

## 2017-09-29 LAB — CBC
HCT: 37.5 % (ref 36.0–46.0)
Hemoglobin: 12.2 g/dL (ref 12.0–15.0)
MCH: 29.8 pg (ref 26.0–34.0)
MCHC: 32.5 g/dL (ref 30.0–36.0)
MCV: 91.7 fL (ref 78.0–100.0)
PLATELETS: 230 10*3/uL (ref 150–400)
RBC: 4.09 MIL/uL (ref 3.87–5.11)
RDW: 14 % (ref 11.5–15.5)
WBC: 6.5 10*3/uL (ref 4.0–10.5)

## 2017-09-29 NOTE — H&P (Signed)
Stacy HolsterLinda Stanley is an 77 y.o. female.   Chief Complaint: left knee pain HPI: Stacy QuinLinda is here again with Stacy Stanley.  She says the replaced left knee will give way sometimes.  She has fallen since the last time she was in.  The opposite right knee is fine.    Radiographs:  X-rays that were ordered, performed, and interpreted by me today included 3 views of the left knee.  AP and lateral view show well-placed components.  On the merchant view her patella appears to be tilted about 45.  Past Medical History:  Diagnosis Date  . Anemia   . Anxiety   . Arthritis   . Complication of anesthesia   . Depression   . Difficult intubation    fiberoptic intubation in the setting of C2 fx 08/29/12; now s/p C1-2 and T2-8 fusion (has limited neck ROM)  . GERD (gastroesophageal reflux disease)   . Headache   . High cholesterol    controlled with Atorvastatin  . History of bronchitis   . History of pneumonia   . Hypertension   . Hyponatremia    states that sodium level drops after surgery  . Hypothyroidism   . Numbness and tingling    hands and feet  . Peripheral neuropathy   . Restless leg syndrome   . Urinary incontinence   . Urinary urgency     Past Surgical History:  Procedure Laterality Date  . ABDOMINAL HYSTERECTOMY    . BREAST LUMPECTOMY Left   . CERVICAL FUSION    . COLONOSCOPY    . EYE SURGERY Bilateral    cataracts  . KNEE ARTHROSCOPY Bilateral   . Medtronic Bladder stimulator insertion    . SPINAL FUSION     posterior  . TONSILLECTOMY    . TOTAL KNEE ARTHROPLASTY Right 08/04/2016   Procedure: TOTAL KNEE ARTHROPLASTY;  Surgeon: Marcene CorningPeter Dalldorf, MD;  Location: [facility] OR;  Service: Orthopedics;  Laterality: Right;  . TOTAL KNEE ARTHROPLASTY Left 03/02/2017   Procedure: TOTAL KNEE ARTHROPLASTY;  Surgeon: Marcene Corningalldorf, Peter, MD;  Location: [facility] OR;  Service: Orthopedics;  Laterality: Left;    No family history on file. Social History:  reports that  has never smoked. she has never used smokeless  tobacco. She reports that she does not drink alcohol or use drugs.  Allergies:  Allergies  Allergen Reactions  . Adhesive [Tape] Itching and Rash    No medications prior to admission.    No results found for this or any previous visit (from the past 48 hour(s)). No results found.  Review of Systems  Musculoskeletal: Positive for joint pain.       Left knee  All other systems reviewed and are negative.   There were no vitals taken for this visit. Physical Exam  Constitutional: She is oriented to person, place, and time. She appears well-developed and well-nourished.  HENT:  Head: Normocephalic and atraumatic.  Eyes: Pupils are equal, round, and reactive to light.  Neck: Normal range of motion.  Cardiovascular: Normal rate and regular rhythm.  Respiratory: Effort normal.  Musculoskeletal:  Left knee motion is good at 0-110.  She can do an active straight leg raise without a lag.  She has a palpable defect in the extensor mechanism near the VMO insertion area.  This is somewhat tender.  Her incision is completely healed.  Calf is soft and nontender.  She has some apprehension to patellar subluxation.   Neurological: She is alert and oriented to person, place, and time.  Skin: Skin is warm and dry.  Psychiatric: She has a normal mood and affect. Her behavior is normal. Judgment and thought content normal.     Assessment/Plan Assessment: Status post left TKR 03/02/17 with possible retinacular tear  Plan: Stacy Stanley experiences some buckling related to her left knee.  I am afraid that she has a retinacular tear.  She has a palpable defect.  I think we should take her back to the main operating room.  We will expose the extensor mechanism and likely perform a medial repair and reefing.  I told her I would have her stay in a knee immobilizer straight for about a month at which point she would resume some physical therapy.  I encouraged a spinal anesthetic and reviewed an overnight stay in  the hospital with discharge home to the care of Stacy Stanley. We discussed the risk of surgery including risk of infection, dvt, and bleeding and she would like to precede.    Marcial Pless, Ginger Organ, PA-C 09/29/2017, 1:58 PM

## 2017-09-29 NOTE — Progress Notes (Signed)
PCP - Josiah LoboJohn McFadden Cardiologist - denies  Chest x-ray - not needed EKG - 03/02/17 Stress Test - denies ECHO - denies Cardiac Cath - denies     Anesthesia review: Yes for difficult intubation  Patient denies shortness of breath, fever, cough and chest pain at PAT appointment   Patient verbalized understanding of instructions that were given to them at the PAT appointment. Patient was also instructed that they will need to review over the PAT instructions again at home before surgery.

## 2017-10-04 MED ORDER — LACTATED RINGERS IV SOLN
INTRAVENOUS | Status: DC
  Administered 2017-10-05: 15:00:00 via INTRAVENOUS

## 2017-10-04 MED ORDER — CEFAZOLIN SODIUM-DEXTROSE 2-4 GM/100ML-% IV SOLN
2.0000 g | INTRAVENOUS | Status: AC
Start: 2017-10-05 — End: 2017-10-05
  Administered 2017-10-05: 2 g via INTRAVENOUS
  Filled 2017-10-04: qty 100

## 2017-10-04 MED ORDER — TRANEXAMIC ACID 1000 MG/10ML IV SOLN
1000.0000 mg | INTRAVENOUS | Status: AC
  Administered 2017-10-05: 1000 mg via INTRAVENOUS
  Filled 2017-10-04: qty 1100

## 2017-10-05 ENCOUNTER — Observation Stay (HOSPITAL_COMMUNITY)
Admission: RE | Admit: 2017-10-05 | Discharge: 2017-10-07 | Disposition: A | Discharge status: Home / Self Care | Payer: Medicare HMO | Source: Ambulatory Visit | Attending: Orthopaedic Surgery | Admitting: Orthopaedic Surgery

## 2017-10-05 ENCOUNTER — Ambulatory Visit (HOSPITAL_COMMUNITY): Payer: Medicare HMO | Admitting: Vascular Surgery

## 2017-10-05 ENCOUNTER — Encounter (HOSPITAL_COMMUNITY): Payer: Self-pay | Admitting: Certified Registered"

## 2017-10-05 ENCOUNTER — Encounter (HOSPITAL_COMMUNITY)
Admission: RE | Disposition: A | Discharge status: Home / Self Care | Payer: Self-pay | Source: Ambulatory Visit | Attending: Orthopaedic Surgery

## 2017-10-05 DIAGNOSIS — Z96653 Presence of artificial knee joint, bilateral: Secondary | ICD-10-CM | POA: Insufficient documentation

## 2017-10-05 DIAGNOSIS — Z79899 Other long term (current) drug therapy: Secondary | ICD-10-CM | POA: Diagnosis not present

## 2017-10-05 DIAGNOSIS — G709 Myoneural disorder, unspecified: Secondary | ICD-10-CM | POA: Diagnosis not present

## 2017-10-05 DIAGNOSIS — N3281 Overactive bladder: Secondary | ICD-10-CM | POA: Insufficient documentation

## 2017-10-05 DIAGNOSIS — I10 Essential (primary) hypertension: Secondary | ICD-10-CM | POA: Insufficient documentation

## 2017-10-05 DIAGNOSIS — Z90710 Acquired absence of both cervix and uterus: Secondary | ICD-10-CM | POA: Diagnosis not present

## 2017-10-05 DIAGNOSIS — E039 Hypothyroidism, unspecified: Secondary | ICD-10-CM | POA: Diagnosis not present

## 2017-10-05 DIAGNOSIS — M25562 Pain in left knee: Secondary | ICD-10-CM | POA: Diagnosis present

## 2017-10-05 DIAGNOSIS — W19XXXA Unspecified fall, initial encounter: Secondary | ICD-10-CM | POA: Diagnosis not present

## 2017-10-05 DIAGNOSIS — F329 Major depressive disorder, single episode, unspecified: Secondary | ICD-10-CM | POA: Diagnosis not present

## 2017-10-05 DIAGNOSIS — Z91048 Other nonmedicinal substance allergy status: Secondary | ICD-10-CM | POA: Diagnosis not present

## 2017-10-05 DIAGNOSIS — Z9889 Other specified postprocedural states: Secondary | ICD-10-CM | POA: Insufficient documentation

## 2017-10-05 DIAGNOSIS — S83002A Unspecified subluxation of left patella, initial encounter: Principal | ICD-10-CM | POA: Insufficient documentation

## 2017-10-05 DIAGNOSIS — G2581 Restless legs syndrome: Secondary | ICD-10-CM | POA: Diagnosis not present

## 2017-10-05 DIAGNOSIS — K219 Gastro-esophageal reflux disease without esophagitis: Secondary | ICD-10-CM | POA: Diagnosis not present

## 2017-10-05 DIAGNOSIS — G629 Polyneuropathy, unspecified: Secondary | ICD-10-CM | POA: Diagnosis not present

## 2017-10-05 DIAGNOSIS — Z981 Arthrodesis status: Secondary | ICD-10-CM | POA: Insufficient documentation

## 2017-10-05 DIAGNOSIS — E7800 Pure hypercholesterolemia, unspecified: Secondary | ICD-10-CM | POA: Insufficient documentation

## 2017-10-05 HISTORY — PX: LIGAMENT REPAIR: SHX5444

## 2017-10-05 SURGERY — REPAIR, LIGAMENT
Anesthesia: Spinal | Laterality: Left

## 2017-10-05 MED ORDER — HYDROCODONE-ACETAMINOPHEN 5-325 MG PO TABS
2.0000 | ORAL_TABLET | ORAL | Status: DC | PRN
  Administered 2017-10-05 – 2017-10-07 (×5): 2 via ORAL
  Filled 2017-10-05 (×5): qty 2

## 2017-10-05 MED ORDER — PREGABALIN 100 MG PO CAPS
400.0000 mg | ORAL_CAPSULE | Freq: Every day | ORAL | Status: DC
  Administered 2017-10-05 – 2017-10-06 (×2): 400 mg via ORAL
  Filled 2017-10-05 (×2): qty 4

## 2017-10-05 MED ORDER — ONDANSETRON HCL 4 MG PO TABS: 4.0000 mg | ORAL_TABLET | Freq: Four times a day (QID) | ORAL | Status: DC | PRN

## 2017-10-05 MED ORDER — LACTATED RINGERS IV SOLN
INTRAVENOUS | Status: DC
  Administered 2017-10-05: 14:00:00 via INTRAVENOUS

## 2017-10-05 MED ORDER — LABETALOL HCL 300 MG PO TABS
300.0000 mg | ORAL_TABLET | Freq: Three times a day (TID) | ORAL | Status: DC
  Administered 2017-10-05 – 2017-10-07 (×5): 300 mg via ORAL
  Filled 2017-10-05 (×6): qty 1

## 2017-10-05 MED ORDER — PROPOFOL 10 MG/ML IV BOLUS
INTRAVENOUS | Status: AC
  Filled 2017-10-05: qty 20

## 2017-10-05 MED ORDER — DOCUSATE SODIUM 100 MG PO CAPS
200.0000 mg | ORAL_CAPSULE | Freq: Every day | ORAL | Status: DC
Start: 2017-10-05 — End: 2017-10-07
  Administered 2017-10-05 – 2017-10-07 (×3): 200 mg via ORAL
  Filled 2017-10-05 (×3): qty 2

## 2017-10-05 MED ORDER — ROPINIROLE HCL 0.25 MG PO TABS: 0.2500 mg | ORAL_TABLET | ORAL | Status: DC

## 2017-10-05 MED ORDER — BUSPIRONE HCL 10 MG PO TABS
10.0000 mg | ORAL_TABLET | Freq: Two times a day (BID) | ORAL | Status: DC
  Administered 2017-10-05 – 2017-10-07 (×4): 10 mg via ORAL
  Filled 2017-10-05 (×4): qty 1

## 2017-10-05 MED ORDER — HYDROMORPHONE HCL 1 MG/ML IJ SOLN
0.5000 mg | INTRAMUSCULAR | Status: DC | PRN
  Administered 2017-10-06: 0.5 mg via INTRAVENOUS
  Administered 2017-10-06 – 2017-10-07 (×2): 1 mg via INTRAVENOUS
  Filled 2017-10-05 (×3): qty 1

## 2017-10-05 MED ORDER — HYDRALAZINE HCL 50 MG PO TABS
50.0000 mg | ORAL_TABLET | Freq: Three times a day (TID) | ORAL | Status: DC
  Administered 2017-10-05 – 2017-10-07 (×5): 50 mg via ORAL
  Filled 2017-10-05 (×5): qty 1

## 2017-10-05 MED ORDER — METHOCARBAMOL 1000 MG/10ML IJ SOLN
500.0000 mg | Freq: Four times a day (QID) | INTRAVENOUS | Status: DC | PRN
  Filled 2017-10-05: qty 5

## 2017-10-05 MED ORDER — ROPINIROLE HCL 0.5 MG PO TABS
0.2500 mg | ORAL_TABLET | ORAL | Status: DC
  Administered 2017-10-06 (×2): 0.25 mg via ORAL
  Filled 2017-10-05: qty 1

## 2017-10-05 MED ORDER — PREGABALIN 100 MG PO CAPS: 200.0000 mg | ORAL_CAPSULE | Freq: Two times a day (BID) | ORAL | Status: DC

## 2017-10-05 MED ORDER — FESOTERODINE FUMARATE ER 8 MG PO TB24
8.0000 mg | ORAL_TABLET | Freq: Every day | ORAL | Status: DC
  Administered 2017-10-05 – 2017-10-07 (×3): 8 mg via ORAL
  Filled 2017-10-05 (×3): qty 1

## 2017-10-05 MED ORDER — MEPERIDINE HCL 25 MG/ML IJ SOLN: 6.2500 mg | INTRAMUSCULAR | Status: DC | PRN

## 2017-10-05 MED ORDER — BUPIVACAINE HCL (PF) 0.75 % IJ SOLN
INTRAMUSCULAR | Status: DC | PRN
  Administered 2017-10-05: 30 mL

## 2017-10-05 MED ORDER — PREGABALIN 100 MG PO CAPS
200.0000 mg | ORAL_CAPSULE | Freq: Every day | ORAL | Status: DC
  Administered 2017-10-06 – 2017-10-07 (×2): 200 mg via ORAL
  Filled 2017-10-05 (×2): qty 2

## 2017-10-05 MED ORDER — DIPHENHYDRAMINE HCL 12.5 MG/5ML PO ELIX
12.5000 mg | ORAL_SOLUTION | ORAL | Status: DC | PRN
  Administered 2017-10-06: 25 mg via ORAL
  Filled 2017-10-05: qty 10

## 2017-10-05 MED ORDER — 0.9 % SODIUM CHLORIDE (POUR BTL) OPTIME
TOPICAL | Status: DC | PRN
  Administered 2017-10-05: 1000 mL

## 2017-10-05 MED ORDER — TRAZODONE HCL 50 MG PO TABS
50.0000 mg | ORAL_TABLET | Freq: Every day | ORAL | Status: DC
Start: 2017-10-05 — End: 2017-10-07
  Administered 2017-10-05 – 2017-10-06 (×2): 50 mg via ORAL
  Filled 2017-10-05 (×2): qty 1

## 2017-10-05 MED ORDER — HYDRALAZINE HCL 20 MG/ML IJ SOLN
INTRAMUSCULAR | Status: DC | PRN
  Administered 2017-10-05 (×2): 5 mg via INTRAVENOUS

## 2017-10-05 MED ORDER — BUPIVACAINE-EPINEPHRINE (PF) 0.5% -1:200000 IJ SOLN
INTRAMUSCULAR | Status: AC
  Filled 2017-10-05: qty 30

## 2017-10-05 MED ORDER — CHLORHEXIDINE GLUCONATE 4 % EX LIQD: 60.0000 mL | Freq: Once | CUTANEOUS | Status: DC

## 2017-10-05 MED ORDER — MIDAZOLAM HCL 2 MG/2ML IJ SOLN
INTRAMUSCULAR | Status: AC
  Filled 2017-10-05: qty 2

## 2017-10-05 MED ORDER — LACTATED RINGERS IV SOLN
INTRAVENOUS | Status: DC
  Administered 2017-10-06: 05:00:00 via INTRAVENOUS

## 2017-10-05 MED ORDER — LEVOTHYROXINE SODIUM 112 MCG PO TABS
112.0000 ug | ORAL_TABLET | Freq: Every day | ORAL | Status: DC
  Administered 2017-10-06 – 2017-10-07 (×2): 112 ug via ORAL
  Filled 2017-10-05 (×2): qty 1

## 2017-10-05 MED ORDER — FENTANYL CITRATE (PF) 100 MCG/2ML IJ SOLN
INTRAMUSCULAR | Status: AC
  Administered 2017-10-05: 50 ug
  Filled 2017-10-05: qty 2

## 2017-10-05 MED ORDER — CEFAZOLIN SODIUM-DEXTROSE 2-4 GM/100ML-% IV SOLN
2.0000 g | Freq: Four times a day (QID) | INTRAVENOUS | Status: AC
  Administered 2017-10-05: 2 g via INTRAVENOUS
  Filled 2017-10-05: qty 100

## 2017-10-05 MED ORDER — BUPIVACAINE IN DEXTROSE 0.75-8.25 % IT SOLN
INTRATHECAL | Status: DC | PRN
  Administered 2017-10-05: 1.6 mL via INTRATHECAL

## 2017-10-05 MED ORDER — MONTELUKAST SODIUM 10 MG PO TABS
10.0000 mg | ORAL_TABLET | Freq: Every day | ORAL | Status: DC
  Administered 2017-10-05 – 2017-10-06 (×2): 10 mg via ORAL
  Filled 2017-10-05 (×2): qty 1

## 2017-10-05 MED ORDER — METHOCARBAMOL 500 MG PO TABS
500.0000 mg | ORAL_TABLET | Freq: Four times a day (QID) | ORAL | Status: DC | PRN
  Administered 2017-10-06 – 2017-10-07 (×3): 500 mg via ORAL
  Filled 2017-10-05 (×3): qty 1

## 2017-10-05 MED ORDER — PROPOFOL 500 MG/50ML IV EMUL
INTRAVENOUS | Status: DC | PRN
  Administered 2017-10-05: 50 ug/kg/min via INTRAVENOUS

## 2017-10-05 MED ORDER — MIDAZOLAM HCL 2 MG/2ML IJ SOLN
INTRAMUSCULAR | Status: AC
  Administered 2017-10-05: 1 mg
  Filled 2017-10-05: qty 2

## 2017-10-05 MED ORDER — HYDROCODONE-ACETAMINOPHEN 5-325 MG PO TABS: 1.0000 | ORAL_TABLET | ORAL | Status: DC | PRN

## 2017-10-05 MED ORDER — ACETAMINOPHEN 325 MG PO TABS: 650.0000 mg | ORAL_TABLET | ORAL | Status: DC | PRN

## 2017-10-05 MED ORDER — ACETAMINOPHEN 650 MG RE SUPP: 650.0000 mg | RECTAL | Status: DC | PRN

## 2017-10-05 MED ORDER — VENLAFAXINE HCL ER 75 MG PO CP24
75.0000 mg | ORAL_CAPSULE | Freq: Two times a day (BID) | ORAL | Status: DC
  Administered 2017-10-05 – 2017-10-07 (×4): 75 mg via ORAL
  Filled 2017-10-05 (×4): qty 1

## 2017-10-05 MED ORDER — ONDANSETRON HCL 4 MG/2ML IJ SOLN: 4.0000 mg | Freq: Four times a day (QID) | INTRAMUSCULAR | Status: DC | PRN

## 2017-10-05 MED ORDER — METOCLOPRAMIDE HCL 5 MG/ML IJ SOLN: 5.0000 mg | Freq: Three times a day (TID) | INTRAMUSCULAR | Status: DC | PRN

## 2017-10-05 MED ORDER — BACLOFEN 10 MG PO TABS: 10.0000 mg | ORAL_TABLET | Freq: Four times a day (QID) | ORAL | Status: DC | PRN

## 2017-10-05 MED ORDER — FENTANYL CITRATE (PF) 100 MCG/2ML IJ SOLN: 25.0000 ug | INTRAMUSCULAR | Status: DC | PRN

## 2017-10-05 MED ORDER — TOPIRAMATE 100 MG PO TABS
100.0000 mg | ORAL_TABLET | Freq: Every day | ORAL | Status: DC
  Administered 2017-10-05 – 2017-10-06 (×2): 100 mg via ORAL
  Filled 2017-10-05 (×2): qty 1

## 2017-10-05 MED ORDER — BUPIVACAINE-EPINEPHRINE 0.5% -1:200000 IJ SOLN
INTRAMUSCULAR | Status: DC | PRN
  Administered 2017-10-05: 30 mL

## 2017-10-05 MED ORDER — ROPINIROLE HCL 0.5 MG PO TABS
0.5000 mg | ORAL_TABLET | Freq: Every day | ORAL | Status: DC
  Administered 2017-10-05 – 2017-10-06 (×2): 0.5 mg via ORAL
  Filled 2017-10-05 (×2): qty 1

## 2017-10-05 MED ORDER — FENTANYL CITRATE (PF) 250 MCG/5ML IJ SOLN
INTRAMUSCULAR | Status: AC
  Filled 2017-10-05: qty 5

## 2017-10-05 MED ORDER — PANTOPRAZOLE SODIUM 40 MG PO TBEC
40.0000 mg | DELAYED_RELEASE_TABLET | Freq: Every day | ORAL | Status: DC
  Administered 2017-10-06 – 2017-10-07 (×2): 40 mg via ORAL
  Filled 2017-10-05 (×2): qty 1

## 2017-10-05 MED ORDER — METOCLOPRAMIDE HCL 5 MG PO TABS: 5.0000 mg | ORAL_TABLET | Freq: Three times a day (TID) | ORAL | Status: DC | PRN

## 2017-10-05 MED ORDER — LOSARTAN POTASSIUM 50 MG PO TABS
100.0000 mg | ORAL_TABLET | Freq: Every day | ORAL | Status: DC
  Administered 2017-10-06 – 2017-10-07 (×2): 100 mg via ORAL
  Filled 2017-10-05 (×2): qty 2

## 2017-10-05 MED ORDER — BUPIVACAINE LIPOSOME 1.3 % IJ SUSP
20.0000 mL | INTRAMUSCULAR | Status: AC
  Administered 2017-10-05: 20 mL
  Filled 2017-10-05: qty 20

## 2017-10-05 MED ORDER — ATORVASTATIN CALCIUM 20 MG PO TABS
20.0000 mg | ORAL_TABLET | Freq: Every day | ORAL | Status: DC
  Administered 2017-10-05 – 2017-10-06 (×2): 20 mg via ORAL
  Filled 2017-10-05 (×2): qty 1

## 2017-10-05 SURGICAL SUPPLY — 47 items
BANDAGE ACE 6X5 VEL STRL LF (GAUZE/BANDAGES/DRESSINGS) ×2 IMPLANT
BANDAGE ELASTIC 6 VELCRO ST LF (GAUZE/BANDAGES/DRESSINGS) ×2 IMPLANT
BANDAGE ESMARK 6X9 LF (GAUZE/BANDAGES/DRESSINGS) ×1 IMPLANT
BLADE SURG 10 STRL SS (BLADE) ×2 IMPLANT
BNDG COHESIVE 4X5 TAN STRL (GAUZE/BANDAGES/DRESSINGS) ×2 IMPLANT
BNDG ESMARK 6X9 LF (GAUZE/BANDAGES/DRESSINGS) ×2
BNDG GAUZE ELAST 4 BULKY (GAUZE/BANDAGES/DRESSINGS) ×2 IMPLANT
BUR ROUND DIAMOND 6.0 EX COAR (BURR) IMPLANT
COVER SURGICAL LIGHT HANDLE (MISCELLANEOUS) ×2 IMPLANT
CUFF TOURNIQUET SINGLE 34IN LL (TOURNIQUET CUFF) IMPLANT
CUFF TOURNIQUET SINGLE 44IN (TOURNIQUET CUFF) ×2 IMPLANT
DRAPE INCISE IOBAN 66X45 STRL (DRAPES) IMPLANT
DRAPE U-SHAPE 47X51 STRL (DRAPES) ×2 IMPLANT
DRSG AQUACEL AG ADV 3.5X10 (GAUZE/BANDAGES/DRESSINGS) ×2 IMPLANT
DRSG PAD ABDOMINAL 8X10 ST (GAUZE/BANDAGES/DRESSINGS) ×2 IMPLANT
DURAPREP 26ML APPLICATOR (WOUND CARE) ×2 IMPLANT
GAUZE SPONGE 4X4 12PLY STRL (GAUZE/BANDAGES/DRESSINGS) ×2 IMPLANT
GAUZE XEROFORM 1X8 LF (GAUZE/BANDAGES/DRESSINGS) ×2 IMPLANT
GLOVE BIO SURGEON STRL SZ8 (GLOVE) ×4 IMPLANT
GLOVE BIOGEL PI IND STRL 8 (GLOVE) ×2 IMPLANT
GLOVE BIOGEL PI INDICATOR 8 (GLOVE) ×2
GOWN STRL REUS W/ TWL LRG LVL3 (GOWN DISPOSABLE) ×1 IMPLANT
GOWN STRL REUS W/TWL 2XL LVL3 (GOWN DISPOSABLE) ×4 IMPLANT
GOWN STRL REUS W/TWL LRG LVL3 (GOWN DISPOSABLE) ×1
IMMOBILIZER KNEE 20 (SOFTGOODS) ×2
IMMOBILIZER KNEE 20 THIGH 36 (SOFTGOODS) ×1 IMPLANT
KIT ROOM TURNOVER OR (KITS) ×2 IMPLANT
MANIFOLD NEPTUNE II (INSTRUMENTS) IMPLANT
NEEDLE 22X1 1/2 (OR ONLY) (NEEDLE) ×2 IMPLANT
PACK ORTHO EXTREMITY (CUSTOM PROCEDURE TRAY) ×2 IMPLANT
PACK UNIVERSAL I (CUSTOM PROCEDURE TRAY) ×2 IMPLANT
PAD ARMBOARD 7.5X6 YLW CONV (MISCELLANEOUS) ×4 IMPLANT
PASSER SUT SWANSON 36MM LOOP (INSTRUMENTS) ×2 IMPLANT
STAPLER VISISTAT 35W (STAPLE) IMPLANT
STOCKINETTE IMPERVIOUS 9X36 MD (GAUZE/BANDAGES/DRESSINGS) ×2 IMPLANT
STRIP CLOSURE SKIN 1/2X4 (GAUZE/BANDAGES/DRESSINGS) ×2 IMPLANT
SUT FIBERWIRE #2 38 T-5 BLUE (SUTURE) ×4
SUT VIC AB 0 CT1 27 (SUTURE) ×1
SUT VIC AB 0 CT1 27XBRD ANBCTR (SUTURE) ×1 IMPLANT
SUT VIC AB 2-0 CT1 27 (SUTURE) ×2
SUT VIC AB 2-0 CT1 TAPERPNT 27 (SUTURE) ×2 IMPLANT
SUT VIC AB 3-0 X1 27 (SUTURE) ×2 IMPLANT
SUTURE FIBERWR #2 38 T-5 BLUE (SUTURE) ×2 IMPLANT
SYR CONTROL 10ML LL (SYRINGE) ×2 IMPLANT
TOWEL OR 17X24 6PK STRL BLUE (TOWEL DISPOSABLE) IMPLANT
TUBE CONNECTING 12X1/4 (SUCTIONS) ×2 IMPLANT
WATER STERILE IRR 1000ML POUR (IV SOLUTION) IMPLANT

## 2017-10-05 NOTE — Transfer of Care (Signed)
Immediate Anesthesia Transfer of Care Note  Patient: Stacy HolsterLinda Stanley  Procedure(s) Performed: LIGAMENT REPAIR LEFT KNEE RETINACULAR REPAIR (Left )  Patient Location: PACU  Anesthesia Type:Spinal  Level of Consciousness: awake, alert , oriented and patient cooperative  Airway & Oxygen Therapy: Patient Spontanous Breathing and Patient connected to nasal cannula oxygen  Post-op Assessment: Report given to RN and Post -op Vital signs reviewed and stable  Post vital signs: Reviewed and stable  Last Vitals:  Vitals:   10/05/17 1402 10/05/17 1500  BP: (!) 187/63 (!) 198/57  Pulse: 64 60  Resp: 18 13  Temp: 36.7 C   SpO2: 97% 99%    Last Pain:  Vitals:   10/05/17 1402  TempSrc: Oral      Patients Stated Pain Goal: 3 (10/05/17 1353)  Complications: No apparent anesthesia complications

## 2017-10-05 NOTE — Interval H&P Note (Signed)
History and Physical Interval Note:  10/05/2017 3:33 PM  Stacy Stanley  has presented today for surgery, with the diagnosis of LEFT KNEE RETINACULAR REPAIR  The various methods of treatment have been discussed with the patient and family. After consideration of risks, benefits and other options for treatment, the patient has consented to  Procedure(s): LIGAMENT REPAIR LEFT KNEE RETINACULAR REPAIR (Left) as a surgical intervention .  The patient's history has been reviewed, patient examined, no change in status, stable for surgery.  I have reviewed the patient's chart and labs.  Questions were answered to the patient's satisfaction.     Brittny Spangle G

## 2017-10-05 NOTE — Anesthesia Procedure Notes (Addendum)
Anesthesia Regional Block: Adductor canal block   Pre-Anesthetic Checklist: ,, timeout performed, Correct Patient, Correct Site, Correct Laterality, Correct Procedure, Correct Position, site marked, Risks and benefits discussed,  Surgical consent,  Pre-op evaluation,  At surgeon's request and post-op pain management  Laterality: Left  Prep: chloraprep       Needles:  Injection technique: Single-shot  Needle Type: Echogenic Stimulator Needle     Needle Length: 5cm  Needle Gauge: 22     Additional Needles:   Procedures:, nerve stimulator,,, ultrasound used (permanent image in chart),,,,  Narrative:  Start time: 10/05/2017 2:54 PM End time: 10/05/2017 3:09 PM Injection made incrementally with aspirations every 5 mL.  Performed by: Personally  Anesthesiologist: Bethena Midgetddono, Ovila Lepage, MD  Additional Notes: Functioning IV was confirmed and monitors were applied.  A 50mm 22ga Arrow echogenic stimulator needle was used. Sterile prep and drape,hand hygiene and sterile gloves were used. Ultrasound guidance: relevant anatomy identified, needle position confirmed, local anesthetic spread visualized around nerve(s)., vascular puncture avoided.  Image printed for medical record. Negative aspiration and negative test dose prior to incremental administration of local anesthetic. The patient tolerated the procedure well.

## 2017-10-05 NOTE — Anesthesia Preprocedure Evaluation (Addendum)
Anesthesia Evaluation  Patient identified by MRN, date of birth, ID band Patient awake    Reviewed: Allergy & Precautions, NPO status , Patient's Chart, lab work & pertinent test results, reviewed documented beta blocker date and time   History of Anesthesia Complications (+) DIFFICULT AIRWAY and history of anesthetic complications  Airway Mallampati: III  TM Distance: >3 FB Neck ROM: Limited    Dental no notable dental hx. (+) Teeth Intact   Pulmonary neg pulmonary ROS,    Pulmonary exam normal breath sounds clear to auscultation       Cardiovascular hypertension, Pt. on medications and Pt. on home beta blockers negative cardio ROS Normal cardiovascular exam Rhythm:Regular Rate:Normal  ECG: SR, rate 68. 1st degree AV block   Neuro/Psych  Headaches, PSYCHIATRIC DISORDERS Anxiety Depression Restless legs syndrome Peripheral neuropathy  Neuromuscular disease negative neurological ROS  negative psych ROS   GI/Hepatic negative GI ROS, Neg liver ROS, GERD  Medicated and Controlled,  Endo/Other  negative endocrine ROSHypothyroidism Hyperlipidemia Sodium drops post op  Renal/GU negative Renal ROS Bladder dysfunction  Overactive bladder negative genitourinary   Musculoskeletal negative musculoskeletal ROS (+) Arthritis , Osteoarthritis,  S/P cervical fusion- limited ROM Chronic LBP   Abdominal (+) + obese,   Peds negative pediatric ROS (+)  Hematology negative hematology ROS (+)   Anesthesia Other Findings Obese Hyperlipidemia Ambulates with walker Bruise to left cheek  fiberoptic intubation in the setting of C2 fx 08/29/12; now s/p C1-2 and T2-8 fusion (has limited neck ROM)  Reproductive/Obstetrics negative OB ROS                            Lab Results  Component Value Date   WBC 6.5 09/29/2017   HGB 12.2 09/29/2017   HCT 37.5 09/29/2017   MCV 91.7 09/29/2017   PLT 230 09/29/2017      Chemistry      Component Value Date/Time   NA 141 09/29/2017 1400   K 4.8 09/29/2017 1400   CL 108 09/29/2017 1400   CO2 17 (L) 09/29/2017 1400   BUN 35 (H) 09/29/2017 1400   CREATININE 0.98 09/29/2017 1400      Component Value Date/Time   CALCIUM 9.8 09/29/2017 1400     EKG: normal sinus rhythm, 1st degree AV block.  Anesthesia Physical  Anesthesia Plan  ASA: III  Anesthesia Plan: Spinal   Post-op Pain Management:  Regional for Post-op pain   Induction:   PONV Risk Score and Plan: 2 and Ondansetron, Dexamethasone, Propofol and Midazolam  Airway Management Planned: Natural Airway and Nasal Cannula  Additional Equipment:   Intra-op Plan:   Post-operative Plan:   Informed Consent: I have reviewed the patients History and Physical, chart, labs and discussed the procedure including the risks, benefits and alternatives for the proposed anesthesia with the patient or authorized representative who has indicated his/her understanding and acceptance.   Dental advisory given  Plan Discussed with: CRNA and Anesthesiologist  Anesthesia Plan Comments:        Anesthesia Quick Evaluation

## 2017-10-05 NOTE — Anesthesia Procedure Notes (Signed)
Spinal  Patient location during procedure: OR Start time: 10/05/2017 4:27 PM End time: 10/05/2017 4:32 PM Staffing Anesthesiologist: Gaynelle AduFitzgerald, Porshia Blizzard, MD Performed: anesthesiologist  Preanesthetic Checklist Completed: patient identified, surgical consent, pre-op evaluation, timeout performed, IV checked, risks and benefits discussed and monitors and equipment checked Spinal Block Patient position: sitting Prep: DuraPrep Patient monitoring: cardiac monitor, continuous pulse ox and blood pressure Approach: midline Location: L4-5 Injection technique: single-shot Needle Needle type: Pencan  Needle gauge: 24 G Needle length: 9 cm Assessment Sensory level: T8 Additional Notes Functioning IV was confirmed and monitors were applied. Sterile prep and drape, including hand hygiene and sterile gloves were used. The patient was positioned and the spine was prepped. The skin was anesthetized with lidocaine.  Free flow of clear CSF was obtained prior to injecting local anesthetic into the CSF.  The spinal needle aspirated freely following injection.  The needle was carefully withdrawn.  The patient tolerated the procedure well.

## 2017-10-05 NOTE — Op Note (Signed)
#  [id number] 

## 2017-10-05 NOTE — Anesthesia Preprocedure Evaluation (Deleted)
                        Lab Results  Component Value Date   WBC 6.5 09/29/2017   HGB 12.2 09/29/2017   HCT 37.5 09/29/2017   MCV 91.7 09/29/2017   PLT 230 09/29/2017     Chemistry      Component Value Date/Time   NA 141 09/29/2017 1400   K 4.8 09/29/2017 1400   CL 108 09/29/2017 1400   CO2 17 (L) 09/29/2017 1400   BUN 35 (H) 09/29/2017 1400   CREATININE 0.98 09/29/2017 1400      Component Value Date/Time   CALCIUM 9.8 09/29/2017 1400     EKG: normal sinus rhythm, 1st degree AV block.  Anesthesia Physical  Anesthesia Plan  ASA: III  Anesthesia Plan: Spinal and Regional   Post-op Pain Management:  Regional for Post-op pain   Induction:   PONV Risk Score and Plan: 2 and Ondansetron, Dexamethasone, Propofol and Midazolam  Airway Management Planned: Natural Airway  Additional Equipment:   Intra-op Plan:   Post-operative Plan:   Informed Consent: I have reviewed the patients History and Physical, chart, labs and discussed the procedure including the risks, benefits and alternatives for the proposed anesthesia with the patient or authorized representative who has indicated his/her understanding and acceptance.   Dental advisory given  Plan Discussed with: CRNA  Anesthesia Plan Comments: (Recent lab results reviewed)        Anesthesia Quick Evaluation

## 2017-10-06 ENCOUNTER — Encounter (HOSPITAL_COMMUNITY): Payer: Self-pay | Admitting: Orthopaedic Surgery

## 2017-10-06 ENCOUNTER — Other Ambulatory Visit: Payer: Self-pay

## 2017-10-06 DIAGNOSIS — S83002A Unspecified subluxation of left patella, initial encounter: Secondary | ICD-10-CM | POA: Diagnosis not present

## 2017-10-06 MED ORDER — TIZANIDINE HCL 4 MG PO TABS: 4.0000 mg | ORAL_TABLET | Freq: Four times a day (QID) | ORAL | 1 refills | Status: AC | PRN

## 2017-10-06 MED ORDER — HYDROCODONE-ACETAMINOPHEN 5-325 MG PO TABS: 1.0000 | ORAL_TABLET | ORAL | 0 refills | Status: DC | PRN

## 2017-10-06 NOTE — Op Note (Signed)
NAME:  Sharee HolsterHOOSIER, Jonella                    ACCOUNT NO.:  MEDICAL RECORD NO.:  [id number]  LOCATION:                                 FACILITY:  PHYSICIAN:  Lubertha Basqueeter G. Jerl Santosalldorf, M.D.     DATE OF BIRTH:  DATE OF PROCEDURE:  10/05/2017 DATE OF DISCHARGE:                              OPERATIVE REPORT   PREOPERATIVE DIAGNOSIS:  Left knee patellar subluxation.  POSTOPERATIVE DIAGNOSIS:  Left knee patellar subluxation.  PROCEDURE:  Left knee patellofemoral reefing.  ANESTHESIA:  Spinal and sedation.  ATTENDING SURGEON:  Lubertha Basqueeter G. Jerl Santosalldorf, MD.  ASSISTANElodia Florence:  Andrew Nida, PA.  INDICATION FOR PROCEDURE:  The patient is a 77 year old woman, who is 7 or 8 months out from a knee replacement.  She fell several times during the postoperative period and has been left with the knee while not very painful, bothersome in that the patella grinds on motion.  On her Merchant view in the office a few weeks ago, she was noted to have significant patellar tilt.  On exam, a defect could be felt along the retinacular repair and concern was that she had disrupted this retinacular repair and consequently was suffering from subluxation of the patella component of the knee replacement.  She was offered open exploration with probable repair of the retinaculum.  Informed operative consent was obtained after discussion of possible complications including reaction to anesthesia, infection, and DVT.  SUMMARY OF FINDINGS AND PROCEDURE:  Under spinal and some sedation through her old incision, the extensor mechanism was exposed.  She did seem to have some moderate patellar tilt in mid flexion.  The retinacular structures were very thin in the area of the repair from midpoint of the patella proximal.  We simply opened this up and I excised about a centimeter of devitalized scar tissue.  This took us back to nice thick tissue on both sides and I reapproximated this once again with #2 FiberWire in an interrupted  fashion.  I also performed a bit of a lateral release with the Bovie.  The knee then ranged well and the patella tracked in a normal fashion with no significant lift off. The wound and knee joint were thoroughly irrigated with saline.  The tourniquet was deflated and a small amount of bleeding was easily controlled with Bovie cautery and some pressure.  I then reapproximated deep tissues with 0 and 2-0 undyed Vicryl followed by skin closure with a subcuticular stitch and Steri-Strips.  Adaptic was applied followed by dry gauze and Ace wrap.  Estimated blood loss and intraoperative fluids as well as accurate tourniquet time can be obtained from anesthesia records.  DISPOSITION:  The patient was taken to the recovery room in stable condition.  She was to be admitted for overnight and potentially 2-night stay.     Lubertha BasquePeter G. Jerl Santosalldorf, M.D.     PGD/MEDQ  D:  10/05/2017  T:  10/06/2017  Job:  [id number]

## 2017-10-06 NOTE — Care Management Note (Signed)
Case Management Note  Patient Details  Name: Sharee HolsterLinda Mondor MRN: [id number] Date of Birth: 02/26/1941  Subjective/Objective:   77 yr old female s/p s/p Left Knee ligament retinacular repair.                 Action/Plan:  Case manager spoke with patient and her husband concerning discharge plan. Choice offered for Home Health agency, referral was called to [facility] Millet[facility], [facility] Liaison. Patient also requested a therapist by the name of Thayer OhmChris, who has worked with her in the past. CM relayed this request. Patient will have family support at discharge.     Expected Discharge Date:  10/06/17               Expected Discharge Plan:  Home w Home Health Services  In-House Referral:  NA  Discharge planning Services  CM Consult  Post Acute Care Choice:  Home Health Choice offered to:  Patient  DME Arranged:  N/A DME Agency:  NA  HH Arranged:  PT HH Agency:  [facility]   Status of Service:  Completed, signed off  If discussed at Long Length of Stay Meetings, dates discussed:    Additional Comments:  Durenda GuthrieBrady, Mykela Mewborn Naomi, RN 10/06/2017, 3:14 PM

## 2017-10-06 NOTE — Anesthesia Postprocedure Evaluation (Signed)
Anesthesia Post Note  Patient: Stacy Stanley  Procedure(s) Performed: LIGAMENT REPAIR LEFT KNEE RETINACULAR REPAIR (Left )     Patient location during evaluation: PACU Anesthesia Type: Spinal Level of consciousness: awake and alert Pain management: pain level controlled Vital Signs Assessment: post-procedure vital signs reviewed and stable Respiratory status: spontaneous breathing and respiratory function stable Cardiovascular status: blood pressure returned to baseline and stable Postop Assessment: spinal receding and no apparent nausea or vomiting Anesthetic complications: no    Last Vitals:  Vitals:   10/06/17 0004 10/06/17 0535  BP: (!) 145/65 (!) 146/60  Pulse: 87 74  Resp: 17 17  Temp: 36.4 C (!) 36.3 C  SpO2: 95% 95%    Last Pain:  Vitals:   10/06/17 0535  TempSrc: Axillary  PainSc:                  [name],W. EDMOND

## 2017-10-06 NOTE — Evaluation (Signed)
Physical Therapy Evaluation Patient Details Name: Stacy HolsterLinda Lo MRN: [id number] DOB: 12/16/1940 Today's Date: 10/06/2017   History of Present Illness   77 y.o. female who was admitted 10/05/2017 for operative treatment of Left knee pain, s/p retinaculum repair. PMH B TKA, TSA, HOH, lupus, osteoporosis.   Clinical Impression  Pt admitted with above diagnosis. Pt currently with functional limitations due to the deficits listed below (see PT Problem List). Mod assist for supine to sit, mod assist for sit to stand. Pt ambulated 10' with RW, distance limited by fatigue and pain in L knee. Pt would benefit from 1 more day in hospital as she fatigues quickly with minimal mobility ans has h/o 5-6 falls in the past year. Pt unable to tolerate stair training today 2* fatigue from ambulating 10', will plan to do stair training tomorrow morning. She has 3 steps to enter her home.  Pt will benefit from skilled PT to increase their independence and safety with mobility to allow discharge to the venue listed below.       Follow Up Recommendations Home health PT    Equipment Recommendations  None recommended by PT    Recommendations for Other Services       Precautions / Restrictions Precautions Precautions: Fall Precaution Comments: pt reports 5-6 falls in past 1 year Required Braces or Orthoses: Knee Immobilizer - Left Knee Immobilizer - Left: On at all times Restrictions Weight Bearing Restrictions: No LLE Weight Bearing: Weight bearing as tolerated      Mobility  Bed Mobility Overal bed mobility: Needs Assistance Bed Mobility: Supine to Sit     Supine to sit: Mod assist     General bed mobility comments: mod assist to raise trunk and pivot hips to edge of bed  Transfers Overall transfer level: Needs assistance Equipment used: Rolling walker (2 wheeled) Transfers: Sit to/from Stand Sit to Stand: Mod assist         General transfer comment: mod A to rise, VCs hand  placement  Ambulation/Gait Ambulation/Gait assistance: Min assist Ambulation Distance (Feet): 10 Feet Assistive device: Rolling walker (2 wheeled) Gait Pattern/deviations: Step-to pattern;Decreased stride length     General Gait Details: VCs for sequencing, no LOB, distance limited by fatigue with 2/4 dyspnea (SaO2 93% on room air), pt reported muscle spasms after ambulating, requested muscle relaxer from RN  Stairs            Wheelchair Mobility    Modified Rankin (Stroke Patients Only)       Balance                                             Pertinent Vitals/Pain Pain Assessment: 0-10 Pain Score: 4  Pain Location: L knee Pain Descriptors / Indicators: Sore;Spasm Pain Intervention(s): Limited activity within patient's tolerance;Monitored during session;Premedicated before session;Repositioned;Ice applied;Patient requesting pain meds-RN notified    Home Living Family/patient expects to be discharged to:: Private residence Living Arrangements: Spouse/significant other Available Help at Discharge: Family;Available 24 hours/day Type of Home: House Home Access: Stairs to enter Entrance Stairs-Rails: LawyerLeft;Right Entrance Stairs-Number of Steps: 3 Home Layout: One level Home Equipment: Walker - 2 wheels;Grab bars - tub/shower;Bedside commode;Walker - 4 wheels      Prior Function Level of Independence: Independent with assistive device(s)         Comments: walks with rollator as needed; 5-6 falls in past 1 year (  most recent 2 weeks ago); plans to have private aide 4 hours/day     Hand Dominance        Extremity/Trunk Assessment   Upper Extremity Assessment Upper Extremity Assessment: Overall WFL for tasks assessed    Lower Extremity Assessment Lower Extremity Assessment: LLE deficits/detail LLE Deficits / Details: decreased sensation to light touch L foot, pt reports h/o peripheral neuopathy but that this is "worse than normal"; LLE in  KI at all times, -3/5 SLR, ankle WNL LLE: Unable to fully assess due to immobilization LLE Sensation: decreased light touch;history of peripheral neuropathy    Cervical / Trunk Assessment Cervical / Trunk Assessment: Normal  Communication   Communication: No difficulties  Cognition Arousal/Alertness: Awake/alert Behavior During Therapy: WFL for tasks assessed/performed Overall Cognitive Status: Within Functional Limits for tasks assessed                                        General Comments      Exercises     Assessment/Plan    PT Assessment Patient needs continued PT services  PT Problem List Decreased strength;Decreased activity tolerance;Decreased balance;Decreased mobility;Pain       PT Treatment Interventions Gait training;Stair training;DME instruction;Functional mobility training;Therapeutic activities;Therapeutic exercise;Balance training    PT Goals (Current goals can be found in the Care Plan section)  Acute Rehab PT Goals Patient Stated Goal: to go home PT Goal Formulation: With patient Time For Goal Achievement: 10/13/17 Potential to Achieve Goals: Good    Frequency Min 5X/week   Barriers to discharge        Co-evaluation               AM-PAC PT "6 Clicks" Daily Activity  Outcome Measure Difficulty turning over in bed (including adjusting bedclothes, sheets and blankets)?: A Lot Difficulty moving from lying on back to sitting on the side of the bed? : Unable Difficulty sitting down on and standing up from a chair with arms (e.g., wheelchair, bedside commode, etc,.)?: A Lot Help needed moving to and from a bed to chair (including a wheelchair)?: A Little Help needed walking in hospital room?: A Little Help needed climbing 3-5 steps with a railing? : Total 6 Click Score: 12    End of Session Equipment Utilized During Treatment: Gait belt;Left knee immobilizer Activity Tolerance: Patient limited by fatigue;Patient limited by  pain Patient left: in chair;with call bell/phone within reach;with family/visitor present Nurse Communication: Mobility status PT Visit Diagnosis: Muscle weakness (generalized) (M62.81);Difficulty in walking, not elsewhere classified (R26.2);Pain Pain - Right/Left: Left Pain - part of body: Knee    Time: 1610-9604 PT Time Calculation (min) (ACUTE ONLY): 50 min   Charges:   PT Evaluation $PT Eval Moderate Complexity: 1 Mod PT Treatments $Gait Training: 8-22 mins $Therapeutic Activity: 8-22 mins   PT G Codes:        }  Tamala Ser 10/06/2017, 11:16 AM [phone]

## 2017-10-06 NOTE — Progress Notes (Signed)
Subjective: 1 Day Post-Op Procedure(s) (LRB): LIGAMENT REPAIR LEFT KNEE RETINACULAR REPAIR (Left)   Patient doing well this morning. Pain contorl is ok and she is looking forward to getting up with PT and going home.  Activity level:  wbat in knee immboliizer Diet tolerance:  ok Voiding:  ok Patient reports pain as mild.    Objective: Vital signs in last 24 hours: Temp:  [97.3 F (36.3 C)-98.2 F (36.8 C)] 97.4 F (36.3 C) (01/30 0535) Pulse Rate:  [60-87] 74 (01/30 0535) Resp:  [13-29] 17 (01/30 0535) BP: (145-198)/(50-73) 146/60 (01/30 0535) SpO2:  [94 %-99 %] 95 % (01/30 0535) Weight:  [88.9 kg (196 lb)] 88.9 kg (196 lb) (01/29 1402)  Labs: No results for input(s): HGB in the last 72 hours. No results for input(s): WBC, RBC, HCT, PLT in the last 72 hours. No results for input(s): NA, K, CL, CO2, BUN, CREATININE, GLUCOSE, CALCIUM in the last 72 hours. No results for input(s): LABPT, INR in the last 72 hours.  Physical Exam:  Neurologically intact ABD soft Neurovascular intact Sensation intact distally Intact pulses distally Dorsiflexion/Plantar flexion intact Incision: dressing C/D/I and no drainage No cellulitis present Compartment soft  Assessment/Plan:  1 Day Post-Op Procedure(s) (LRB): LIGAMENT REPAIR LEFT KNEE RETINACULAR REPAIR (Left) Advance diet Up with therapy  Discharge home today after PT if doing well. Follow up in office 10-14days post op. Keep bandage clean and dry until follow up.  Stacy Stanley, Stacy OrganNDREW PAUL 10/06/2017, 8:03 AM

## 2017-10-06 NOTE — Discharge Summary (Addendum)
Patient ID: Stacy HolsterLinda Stanley MRN: [id number] DOB/AGE: 75/01/1941 75 y.o.  Admit date: 10/05/2017 Discharge date: 10/07/2017  Admission Diagnoses:  Principal Problem:   Left knee pain   Discharge Diagnoses:  Same  Past Medical History:  Diagnosis Date  . Anemia   . Anxiety   . Arthritis   . Complication of anesthesia   . Depression   . Difficult intubation    fiberoptic intubation in the setting of C2 fx 08/29/12; now s/p C1-2 and T2-8 fusion (has limited neck ROM)  . GERD (gastroesophageal reflux disease)   . Headache   . High cholesterol    controlled with Atorvastatin  . History of bronchitis   . History of pneumonia   . Hypertension   . Hyponatremia    states that sodium level drops after surgery  . Hypothyroidism   . Numbness and tingling    hands and feet  . Peripheral neuropathy   . Restless leg syndrome   . Urinary incontinence   . Urinary urgency     Surgeries: Procedure(s): LIGAMENT REPAIR LEFT KNEE RETINACULAR REPAIR on 10/05/2017   Consultants:   Discharged Condition: Improved  Hospital Course: Stacy HolsterLinda Stanley is an 77 y.o. female who was admitted 10/05/2017 for operative treatment ofLeft knee pain. Patient has severe unremitting pain that affects sleep, daily activities, and work/hobbies. After pre-op clearance the patient was taken to the operating room on 10/05/2017 and underwent  Procedure(s): LIGAMENT REPAIR LEFT KNEE RETINACULAR REPAIR.    Patient was given perioperative antibiotics:  Anti-infectives (From admission, onward)   Start     Dose/Rate Route Frequency Ordered Stop   10/05/17 1930  ceFAZolin (ANCEF) IVPB 2g/100 mL premix     2 g 200 mL/hr over 30 Minutes Intravenous Every 6 hours 10/05/17 1806 10/05/17 2110   10/05/17 1430  ceFAZolin (ANCEF) IVPB 2g/100 mL premix     2 g 200 mL/hr over 30 Minutes Intravenous To ShortStay [facility] 10/04/17 1024 10/05/17 1705       Patient was given sequential compression devices, early ambulation, and  chemoprophylaxis to prevent DVT.  Patient benefited maximally from hospital stay and there were no complications.    Recent vital signs:  Patient Vitals for the past 24 hrs:  BP Temp Temp src Pulse Resp SpO2 Height Weight  10/06/17 0535 (!) 146/60 (!) 97.4 F (36.3 C) Axillary 74 17 95 % - -  10/06/17 0004 (!) 145/65 97.6 F (36.4 C) Oral 87 17 95 % - -  10/05/17 2311 (!) 188/61 - - 80 - - - -  10/05/17 1945 (!) 166/60 98.2 F (36.8 C) Oral 72 20 94 % - -  10/05/17 1932 (!) 168/62 - - 70 (!) 26 94 % - -  10/05/17 1930 - (!) 97.3 F (36.3 C) - 69 (!) 22 94 % - -  10/05/17 1917 (!) 167/73 - - 73 (!) 29 95 % - -  10/05/17 1915 - - - 71 18 95 % - -  10/05/17 1901 (!) 165/63 - - 68 16 94 % - -  10/05/17 1900 - - - 68 16 95 % - -  10/05/17 1847 (!) 160/66 - - 68 (!) 22 95 % - -  10/05/17 1832 (!) 167/71 - - 73 19 95 % - -  10/05/17 1830 - - - 73 17 95 % - -  10/05/17 1817 (!) 170/50 - - 72 20 95 % - -  10/05/17 1801 (!) 182/70 - - 71 (!) 22 96 % - -  10/05/17 1800 - 97.6 F (36.4 C) - 74 - 97 % - -  10/05/17 1500 (!) 198/57 - - 60 13 99 % - -  10/05/17 1402 (!) 187/63 98.1 F (36.7 C) Oral 64 18 97 % 4\' 11"  (1.499 m) 88.9 kg (196 lb)     Recent laboratory studies: No results for input(s): WBC, HGB, HCT, PLT, NA, K, CL, CO2, BUN, CREATININE, GLUCOSE, INR, CALCIUM in the last 72 hours.  Invalid input(s): PT, 2   Discharge Medications:   Allergies as of 10/06/2017      Reactions   Adhesive [tape] Itching, Rash      Medication List    STOP taking these medications   traMADol 50 MG tablet Commonly known as:  ULTRAM     TAKE these medications   atorvastatin 20 MG tablet Commonly known as:  LIPITOR Take 20 mg by mouth daily with supper.   baclofen 10 MG tablet Commonly known as:  LIORESAL Take 10 mg by mouth 4 (four) times daily as needed for muscle spasms.   busPIRone 10 MG tablet Commonly known as:  BUSPAR Take 10 mg by mouth 2 (two) times daily.   celecoxib 200 MG  capsule Commonly known as:  CELEBREX Take 200 mg by mouth daily.   docusate sodium 100 MG capsule Commonly known as:  COLACE Take 200 mg by mouth daily.   ferrous sulfate 325 (65 FE) MG EC tablet Take 325 mg by mouth daily.   Fish Oil 1000 MG Caps Take 1,000 mg by mouth daily.   GLUCOSAMINE CHONDR 1500 COMPLX Caps Take 1 capsule by mouth daily.   HAIR/SKIN/NAILS Tabs Take 1 tablet by mouth daily.   hydrALAZINE 50 MG tablet Commonly known as:  APRESOLINE Take 50 mg by mouth 3 (three) times daily.   HYDROcodone-acetaminophen 5-325 MG tablet Commonly known as:  NORCO/VICODIN Take 1-2 tablets by mouth every 4 (four) hours as needed for moderate pain. What changed:  how much to take   labetalol 300 MG tablet Commonly known as:  NORMODYNE Take 300 mg by mouth 3 (three) times daily.   levothyroxine 112 MCG tablet Commonly known as:  SYNTHROID, LEVOTHROID Take 112 mcg by mouth daily before breakfast.   losartan 100 MG tablet Commonly known as:  COZAAR Take 100 mg by mouth daily.   montelukast 10 MG tablet Commonly known as:  SINGULAIR Take 10 mg by mouth at bedtime.   multivitamin with minerals Tabs tablet Take 1 tablet by mouth daily.   omeprazole 40 MG capsule Commonly known as:  PRILOSEC Take 40 mg by mouth daily.   pregabalin 200 MG capsule Commonly known as:  LYRICA Take 200-400 mg by mouth 2 (two) times daily. Take 200mg  in the morning and 400mg s at night   rOPINIRole 0.25 MG tablet Commonly known as:  REQUIP Take 0.25-0.5 mg by mouth See admin instructions. Take 0.25mg s at 3pm, 0.25mg s at dinner, and 0.5mg s at bedtime   tiZANidine 4 MG tablet Commonly known as:  ZANAFLEX Take 1 tablet (4 mg total) by mouth every 6 (six) hours as needed for muscle spasms.   tolterodine 4 MG 24 hr capsule Commonly known as:  DETROL LA Take 4 mg by mouth daily with supper.   topiramate 100 MG tablet Commonly known as:  TOPAMAX Take 100 mg by mouth at bedtime.    traZODone 50 MG tablet Commonly known as:  DESYREL Take 50-150 mg by mouth at bedtime.   venlafaxine XR 75 MG 24 hr  capsule Commonly known as:  EFFEXOR-XR Take 75 mg by mouth 2 (two) times daily.   Vitamin D3 2000 units capsule Take 2,000 Units by mouth daily.       Diagnostic Studies: No results found.  Disposition: 06-Home-Health Care Svc  Discharge Instructions    Call MD / Call 911   Complete by:  As directed    If you experience chest pain or shortness of breath, CALL 911 and be transported to the hospital emergency room.  If you develope a fever above 101 F, pus (white drainage) or increased drainage or redness at the wound, or calf pain, call your surgeon's office.   Constipation Prevention   Complete by:  As directed    Drink plenty of fluids.  Prune juice may be helpful.  You may use a stool softener, such as Colace (over the counter) 100 mg twice a day.  Use MiraLax (over the counter) for constipation as needed.   Diet - low sodium heart healthy   Complete by:  As directed    Discharge instructions   Complete by:  As directed    Must have knee immobilizer on while up and walking. Keep bandage clan and dry until follow up. Follow up in office 10-14 days   Increase activity slowly as tolerated   Complete by:  As directed       Follow-up Information    Marcene Corning, MD. Schedule an appointment as soon as possible for a visit in 2 weeks.   Specialty:  Orthopedic Surgery Contact information: [address] [place] [address] [phone]            Signed: Drema Halon 10/06/2017, 8:08 AM

## 2017-10-06 NOTE — Plan of Care (Signed)
  Progressing Education: Knowledge of General Education information will improve 10/06/2017 1006 - Progressing by Darreld Mcleanox, Janaysha Depaulo, RN Health Behavior/Discharge Planning: Ability to manage health-related needs will improve 10/06/2017 1006 - Progressing by Darreld Mcleanox, Davonne Jarnigan, RN Clinical Measurements: Ability to maintain clinical measurements within normal limits will improve 10/06/2017 1006 - Progressing by Darreld Mcleanox, Mairim Bade, RN Will remain free from infection 10/06/2017 1006 - Progressing by Darreld Mcleanox, Bridget Westbrooks, RN Diagnostic test results will improve 10/06/2017 1006 - Progressing by Darreld Mcleanox, Manson Luckadoo, RN Respiratory complications will improve 10/06/2017 1006 - Progressing by Darreld Mcleanox, Corine Solorio, RN Cardiovascular complication will be avoided 10/06/2017 1006 - Progressing by Darreld Mcleanox, Delwin Raczkowski, RN Activity: Risk for activity intolerance will decrease 10/06/2017 1006 - Progressing by Darreld Mcleanox, Elisa Sorlie, RN Nutrition: Adequate nutrition will be maintained 10/06/2017 1006 - Progressing by Darreld Mcleanox, Liesa Tsan, RN Coping: Level of anxiety will decrease 10/06/2017 1006 - Progressing by Darreld Mcleanox, Lennart Gladish, RN Elimination: Will not experience complications related to bowel motility 10/06/2017 1006 - Progressing by Darreld Mcleanox, Verdine Grenfell, RN Will not experience complications related to urinary retention 10/06/2017 1006 - Progressing by Darreld Mcleanox, Lossie Kalp, RN Pain Managment: General experience of comfort will improve 10/06/2017 1006 - Progressing by Darreld Mcleanox, Kaysha Parsell, RN Safety: Ability to remain free from injury will improve 10/06/2017 1006 - Progressing by Darreld Mcleanox, Adreanna Fickel, RN Skin Integrity: Risk for impaired skin integrity will decrease 10/06/2017 1006 - Progressing by Darreld Mcleanox, Nicolemarie Wooley, RN Education: Knowledge of the prescribed therapeutic regimen will improve 10/06/2017 1006 - Progressing by Darreld Mcleanox, Annastasia Haskins, RN Activity: Ability to avoid complications of mobility impairment will improve 10/06/2017 1006 - Progressing by Darreld Mcleanox, Monifah Freehling, RN Range of joint motion will improve 10/06/2017 1006 - Progressing by Darreld Mcleanox, Ladiamond Gallina, RN Clinical  Measurements: Postoperative complications will be avoided or minimized 10/06/2017 1006 - Progressing by Darreld Mcleanox, Akil Hoos, RN Pain Management: Pain level will decrease with appropriate interventions 10/06/2017 1006 - Progressing by Darreld Mcleanox, Rafay Dahan, RN Skin Integrity: Signs of wound healing will improve 10/06/2017 1006 - Progressing by Darreld Mcleanox, Homer Miller, RN

## 2017-10-06 NOTE — Progress Notes (Signed)
PT Cancellation Note  Patient Details Name: Sharee HolsterLinda Alpert MRN: [id number] DOB: 04/18/1941   Cancelled Treatment:    Reason Eval/Treat Not Completed: Pain limiting ability to participate(will check back after pt receives pain medicine. Pain meds requested. )   Tamala SerUhlenberg, Soriya Worster Kistler 10/06/2017, 9:25 AM [phone]

## 2017-10-07 DIAGNOSIS — S83002A Unspecified subluxation of left patella, initial encounter: Secondary | ICD-10-CM | POA: Diagnosis not present

## 2017-10-07 LAB — GLUCOSE, CAPILLARY: GLUCOSE-CAPILLARY: 95 mg/dL (ref 65–99)

## 2017-10-07 NOTE — Progress Notes (Signed)
Pt discharged home with husband. AVS and script reviewed and given to patient. All belongings sent with patient. VSS> BP (!) 149/79 (BP Location: Right Arm)   Pulse 71   Temp 97.8 F (36.6 C) (Oral)   Resp 16   Ht 4\' 11"  (1.499 m)   Wt 88.9 kg (196 lb)   SpO2 98%   BMI 39.59 kg/m

## 2017-10-07 NOTE — Progress Notes (Signed)
Subjective: 2 Days Post-Op Procedure(s) (LRB): LIGAMENT REPAIR LEFT KNEE RETINACULAR REPAIR (Left)   Patient feels much better this morning. She would like to go home after PT as long as she continues to feel well.  Activity level:  wbat Diet tolerance:  ok Voiding:  ok Patient reports pain as mild.    Objective: Vital signs in last 24 hours: Temp:  [98.3 F (36.8 C)-98.5 F (36.9 C)] 98.5 F (36.9 C) (01/30 2115) Pulse Rate:  [72-73] 73 (01/30 2115) Resp:  [16-18] 16 (01/30 2115) BP: (156-169)/(64-73) 156/73 (01/30 2115) SpO2:  [95 %-97 %] 95 % (01/30 2357)  Labs: No results for input(s): HGB in the last 72 hours. No results for input(s): WBC, RBC, HCT, PLT in the last 72 hours. No results for input(s): NA, K, CL, CO2, BUN, CREATININE, GLUCOSE, CALCIUM in the last 72 hours. No results for input(s): LABPT, INR in the last 72 hours.  Physical Exam:  Neurologically intact ABD soft Neurovascular intact Sensation intact distally Intact pulses distally Dorsiflexion/Plantar flexion intact Incision: dressing C/D/I and no drainage No cellulitis present Compartment soft  Assessment/Plan:  2 Days Post-Op Procedure(s) (LRB): LIGAMENT REPAIR LEFT KNEE RETINACULAR REPAIR (Left) Advance diet Up with therapy Discharge home with home health Today after PT. Follow up in office 2 weeks post op. Must wear knee immobilizer at all times when up and ambulating.  Stacy Stanley, Ginger OrganNDREW PAUL 10/07/2017, 7:10 AM

## 2017-10-07 NOTE — Progress Notes (Signed)
Physical Therapy Treatment Patient Details Name: Stacy HolsterLinda Stanley MRN: [id number] DOB: 04/21/1941 Today's Date: 10/07/2017    History of Present Illness  77 y.o. female who was admitted 10/05/2017 for operative treatment of Left knee pain, s/p retinaculum repair. PMH B TKA, TSA, HOH, lupus, osteoporosis.     PT Comments    Pt is progressing very well; amb up down 2 steps and amb 190' with RW and min guard to supervision; will benefit from HHPT and supervision/assist for mobility initially at home; ready for D/C from PT standpoint  Follow Up Recommendations  Home health PT     Equipment Recommendations  None recommended by PT    Recommendations for Other Services       Precautions / Restrictions Precautions Precautions: Fall Precaution Comments: pt reports 5-6 falls in past 1 year Required Braces or Orthoses: Knee Immobilizer - Left Knee Immobilizer - Left: On at all times(top and bottom padded with yellow foam, pt c/o irritation) Restrictions Weight Bearing Restrictions: No LLE Weight Bearing: Weight bearing as tolerated    Mobility  Bed Mobility Overal bed mobility: Needs Assistance Bed Mobility: Supine to Sit     Supine to sit: Min assist     General bed mobility comments: incr time, effortful transition, assist with LLE  Transfers   Equipment used: Rolling walker (2 wheeled) Transfers: Sit to/from Stand Sit to Stand: Min guard;Min assist         General transfer comment: light assist to rise, bed ht elevated (as like home), cues for hand placement  Ambulation/Gait Ambulation/Gait assistance: Min guard Ambulation Distance (Feet): 190 Feet Assistive device: Rolling walker (2 wheeled) Gait Pattern/deviations: Decreased stride length;Step-through pattern     General Gait Details: verbal cues for sequencing and posture   Stairs Stairs: Yes   Stair Management: Step to pattern;Forwards;With walker Number of Stairs: 1(x2) General stair comments: cues for  sequence  Wheelchair Mobility    Modified Rankin (Stroke Patients Only)       Balance Overall balance assessment: History of Falls;Needs assistance           Standing balance-Leahy Scale: Poor Standing balance comment: requires at least unilateral assist for functional task and min/guard for  safety                            Cognition Arousal/Alertness: Awake/alert Behavior [facility] for tasks assessed/performed Overall Cognitive Status: Within Functional Limits for tasks assessed                                        Exercises      General Comments        Pertinent Vitals/Pain Pain Assessment: No/denies pain    Home Living                      Prior Function            PT Goals (current goals can now be found in the care plan section) Acute Rehab PT Goals Patient Stated Goal: to go home PT Goal Formulation: With patient Time For Goal Achievement: 10/13/17 Potential to Achieve Goals: Good Progress towards PT goals: Progressing toward goals    Frequency    Min 5X/week      PT Plan Current plan remains appropriate    Co-evaluation  AM-PAC PT "6 Clicks" Daily Activity  Outcome Measure  Difficulty turning over in bed (including adjusting bedclothes, sheets and blankets)?: A Lot Difficulty moving from lying on back to sitting on the side of the bed? : Unable Difficulty sitting down on and standing up from a chair with arms (e.g., wheelchair, bedside commode, etc,.)?: A Lot Help needed moving to and from a bed to chair (including a wheelchair)?: A Little Help needed walking in hospital room?: A Little Help needed climbing 3-5 steps with a railing? : A Little 6 Click Score: 14    End of Session Equipment Utilized During Treatment: Gait belt;Left knee immobilizer Activity Tolerance: Patient tolerated treatment well Patient left: in chair;with call bell/phone within reach   PT Visit  Diagnosis: Muscle weakness (generalized) (M62.81);Difficulty in walking, not elsewhere classified (R26.2);Pain Pain - Right/Left: Left Pain - part of body: Knee     Time: 1610-9604 PT Time Calculation (min) (ACUTE ONLY): 31 min  Charges:  $Gait Training: 23-37 mins                    G CodesDrucilla Chalet, PT Pager: [phone] 10/07/2017    Drucilla Chalet 10/07/2017, 10:01 AM

## 2017-10-19 NOTE — Addendum Note (Signed)
Addendum  created 10/19/17 0729 by Bethena Midgetddono, Rashae Rother, MD   Intraprocedure Blocks edited, Sign clinical note

## 2019-10-12 ENCOUNTER — Other Ambulatory Visit: Payer: Self-pay | Admitting: Orthopedic Surgery

## 2019-10-12 DIAGNOSIS — M25512 Pain in left shoulder: Secondary | ICD-10-CM

## 2019-10-12 DIAGNOSIS — G8929 Other chronic pain: Secondary | ICD-10-CM

## 2020-05-14 ENCOUNTER — Encounter (HOSPITAL_BASED_OUTPATIENT_CLINIC_OR_DEPARTMENT_OTHER): Payer: Self-pay

## 2020-05-14 ENCOUNTER — Other Ambulatory Visit: Payer: Self-pay

## 2020-05-14 DIAGNOSIS — K635 Polyp of colon: Secondary | ICD-10-CM | POA: Diagnosis present

## 2020-05-14 DIAGNOSIS — K219 Gastro-esophageal reflux disease without esophagitis: Secondary | ICD-10-CM | POA: Diagnosis present

## 2020-05-14 DIAGNOSIS — Z881 Allergy status to other antibiotic agents status: Secondary | ICD-10-CM

## 2020-05-14 DIAGNOSIS — K5730 Diverticulosis of large intestine without perforation or abscess without bleeding: Secondary | ICD-10-CM | POA: Diagnosis present

## 2020-05-14 DIAGNOSIS — K55031 Focal (segmental) acute (reversible) ischemia of large intestine: Secondary | ICD-10-CM | POA: Diagnosis not present

## 2020-05-14 DIAGNOSIS — Z8701 Personal history of pneumonia (recurrent): Secondary | ICD-10-CM

## 2020-05-14 DIAGNOSIS — Z981 Arthrodesis status: Secondary | ICD-10-CM

## 2020-05-14 DIAGNOSIS — Z90710 Acquired absence of both cervix and uterus: Secondary | ICD-10-CM

## 2020-05-14 DIAGNOSIS — Z9181 History of falling: Secondary | ICD-10-CM

## 2020-05-14 DIAGNOSIS — Z79890 Hormone replacement therapy (postmenopausal): Secondary | ICD-10-CM

## 2020-05-14 DIAGNOSIS — I10 Essential (primary) hypertension: Secondary | ICD-10-CM | POA: Diagnosis present

## 2020-05-14 DIAGNOSIS — Z96653 Presence of artificial knee joint, bilateral: Secondary | ICD-10-CM | POA: Diagnosis present

## 2020-05-14 DIAGNOSIS — E669 Obesity, unspecified: Secondary | ICD-10-CM | POA: Diagnosis present

## 2020-05-14 DIAGNOSIS — E039 Hypothyroidism, unspecified: Secondary | ICD-10-CM | POA: Diagnosis present

## 2020-05-14 DIAGNOSIS — G629 Polyneuropathy, unspecified: Secondary | ICD-10-CM | POA: Diagnosis present

## 2020-05-14 DIAGNOSIS — K529 Noninfective gastroenteritis and colitis, unspecified: Secondary | ICD-10-CM | POA: Diagnosis not present

## 2020-05-14 DIAGNOSIS — E860 Dehydration: Secondary | ICD-10-CM | POA: Diagnosis present

## 2020-05-14 DIAGNOSIS — R32 Unspecified urinary incontinence: Secondary | ICD-10-CM | POA: Diagnosis present

## 2020-05-14 DIAGNOSIS — R3915 Urgency of urination: Secondary | ICD-10-CM | POA: Diagnosis present

## 2020-05-14 DIAGNOSIS — N179 Acute kidney failure, unspecified: Secondary | ICD-10-CM | POA: Diagnosis present

## 2020-05-14 DIAGNOSIS — E7800 Pure hypercholesterolemia, unspecified: Secondary | ICD-10-CM | POA: Diagnosis present

## 2020-05-14 DIAGNOSIS — G2581 Restless legs syndrome: Secondary | ICD-10-CM | POA: Diagnosis present

## 2020-05-14 DIAGNOSIS — Z6839 Body mass index (BMI) 39.0-39.9, adult: Secondary | ICD-10-CM

## 2020-05-14 DIAGNOSIS — E871 Hypo-osmolality and hyponatremia: Secondary | ICD-10-CM | POA: Diagnosis present

## 2020-05-14 DIAGNOSIS — Z20822 Contact with and (suspected) exposure to covid-19: Secondary | ICD-10-CM | POA: Diagnosis present

## 2020-05-14 DIAGNOSIS — D62 Acute posthemorrhagic anemia: Secondary | ICD-10-CM | POA: Diagnosis present

## 2020-05-14 DIAGNOSIS — Z79899 Other long term (current) drug therapy: Secondary | ICD-10-CM

## 2020-05-14 LAB — COMPREHENSIVE METABOLIC PANEL
ALT: 21 U/L (ref 0–44)
AST: 19 U/L (ref 15–41)
Albumin: 4.2 g/dL (ref 3.5–5.0)
Alkaline Phosphatase: 68 U/L (ref 38–126)
Anion gap: 9 (ref 5–15)
BUN: 48 mg/dL — ABNORMAL HIGH (ref 8–23)
CO2: 27 mmol/L (ref 22–32)
Calcium: 9.5 mg/dL (ref 8.9–10.3)
Chloride: 98 mmol/L (ref 98–111)
Creatinine, Ser: 1.18 mg/dL — ABNORMAL HIGH (ref 0.44–1.00)
GFR calc Af Amer: 52 mL/min — ABNORMAL LOW (ref >60)
GFR calc non Af Amer: 44 mL/min — ABNORMAL LOW (ref >60)
Glucose, Bld: 129 mg/dL — ABNORMAL HIGH (ref 70–99)
Potassium: 4.6 mmol/L (ref 3.5–5.1)
Sodium: 134 mmol/L — ABNORMAL LOW (ref 135–145)
Total Bilirubin: 0.2 mg/dL — ABNORMAL LOW (ref 0.3–1.2)
Total Protein: 7.2 g/dL (ref 6.5–8.1)

## 2020-05-14 LAB — CBC
HCT: 39.7 % (ref 36.0–46.0)
Hemoglobin: 12.7 g/dL (ref 12.0–15.0)
MCH: 29.8 pg (ref 26.0–34.0)
MCHC: 32 g/dL (ref 30.0–36.0)
MCV: 93.2 fL (ref 80.0–100.0)
Platelets: 269 10*3/uL (ref 150–400)
RBC: 4.26 MIL/uL (ref 3.87–5.11)
RDW: 13.6 % (ref 11.5–15.5)
WBC: 12.4 10*3/uL — ABNORMAL HIGH (ref 4.0–10.5)
nRBC: 0 % (ref 0.0–0.2)

## 2020-05-14 NOTE — ED Triage Notes (Signed)
Pt arrives with rolling walker and husband, reports she has been constipated over the past few days states around 1130 she wen tto the bathroom and started having bright red blood with a little stool. Reports abdominal cramping and pain.

## 2020-05-15 ENCOUNTER — Inpatient Hospital Stay (HOSPITAL_BASED_OUTPATIENT_CLINIC_OR_DEPARTMENT_OTHER)
Admission: EM | Admit: 2020-05-15 | Discharge: 2020-05-17 | DRG: 394 | Disposition: A | Discharge status: Home / Self Care | Payer: Medicare Other | Attending: Internal Medicine | Admitting: Internal Medicine

## 2020-05-15 ENCOUNTER — Emergency Department (HOSPITAL_BASED_OUTPATIENT_CLINIC_OR_DEPARTMENT_OTHER): Payer: Medicare Other

## 2020-05-15 ENCOUNTER — Encounter (HOSPITAL_BASED_OUTPATIENT_CLINIC_OR_DEPARTMENT_OTHER): Payer: Self-pay | Admitting: Radiology

## 2020-05-15 DIAGNOSIS — E871 Hypo-osmolality and hyponatremia: Secondary | ICD-10-CM | POA: Diagnosis present

## 2020-05-15 DIAGNOSIS — Z881 Allergy status to other antibiotic agents status: Secondary | ICD-10-CM | POA: Diagnosis not present

## 2020-05-15 DIAGNOSIS — K55031 Focal (segmental) acute (reversible) ischemia of large intestine: Secondary | ICD-10-CM | POA: Diagnosis present

## 2020-05-15 DIAGNOSIS — N179 Acute kidney failure, unspecified: Secondary | ICD-10-CM | POA: Diagnosis present

## 2020-05-15 DIAGNOSIS — Z79890 Hormone replacement therapy (postmenopausal): Secondary | ICD-10-CM | POA: Diagnosis not present

## 2020-05-15 DIAGNOSIS — E860 Dehydration: Secondary | ICD-10-CM | POA: Diagnosis present

## 2020-05-15 DIAGNOSIS — E039 Hypothyroidism, unspecified: Secondary | ICD-10-CM | POA: Diagnosis present

## 2020-05-15 DIAGNOSIS — I10 Essential (primary) hypertension: Secondary | ICD-10-CM | POA: Diagnosis present

## 2020-05-15 DIAGNOSIS — K922 Gastrointestinal hemorrhage, unspecified: Secondary | ICD-10-CM | POA: Diagnosis present

## 2020-05-15 DIAGNOSIS — K635 Polyp of colon: Secondary | ICD-10-CM | POA: Diagnosis present

## 2020-05-15 DIAGNOSIS — K5730 Diverticulosis of large intestine without perforation or abscess without bleeding: Secondary | ICD-10-CM | POA: Diagnosis present

## 2020-05-15 DIAGNOSIS — K559 Vascular disorder of intestine, unspecified: Secondary | ICD-10-CM | POA: Diagnosis not present

## 2020-05-15 DIAGNOSIS — E669 Obesity, unspecified: Secondary | ICD-10-CM | POA: Diagnosis present

## 2020-05-15 DIAGNOSIS — K529 Noninfective gastroenteritis and colitis, unspecified: Secondary | ICD-10-CM | POA: Diagnosis present

## 2020-05-15 DIAGNOSIS — Z96653 Presence of artificial knee joint, bilateral: Secondary | ICD-10-CM | POA: Diagnosis present

## 2020-05-15 DIAGNOSIS — D62 Acute posthemorrhagic anemia: Secondary | ICD-10-CM | POA: Diagnosis present

## 2020-05-15 DIAGNOSIS — Z981 Arthrodesis status: Secondary | ICD-10-CM | POA: Diagnosis not present

## 2020-05-15 DIAGNOSIS — R32 Unspecified urinary incontinence: Secondary | ICD-10-CM | POA: Diagnosis present

## 2020-05-15 DIAGNOSIS — Z6839 Body mass index (BMI) 39.0-39.9, adult: Secondary | ICD-10-CM | POA: Diagnosis not present

## 2020-05-15 DIAGNOSIS — K219 Gastro-esophageal reflux disease without esophagitis: Secondary | ICD-10-CM | POA: Diagnosis present

## 2020-05-15 DIAGNOSIS — Z9181 History of falling: Secondary | ICD-10-CM | POA: Diagnosis not present

## 2020-05-15 DIAGNOSIS — G2581 Restless legs syndrome: Secondary | ICD-10-CM | POA: Diagnosis present

## 2020-05-15 DIAGNOSIS — G629 Polyneuropathy, unspecified: Secondary | ICD-10-CM | POA: Diagnosis present

## 2020-05-15 DIAGNOSIS — R3915 Urgency of urination: Secondary | ICD-10-CM | POA: Diagnosis present

## 2020-05-15 DIAGNOSIS — Z20822 Contact with and (suspected) exposure to covid-19: Secondary | ICD-10-CM | POA: Diagnosis present

## 2020-05-15 DIAGNOSIS — E7800 Pure hypercholesterolemia, unspecified: Secondary | ICD-10-CM | POA: Diagnosis present

## 2020-05-15 DIAGNOSIS — Z79899 Other long term (current) drug therapy: Secondary | ICD-10-CM | POA: Diagnosis not present

## 2020-05-15 LAB — CBC
HCT: 35.2 % — ABNORMAL LOW (ref 36.0–46.0)
HCT: 34.8 % — ABNORMAL LOW (ref 36.0–46.0)
Hemoglobin: 11.5 g/dL — ABNORMAL LOW (ref 12.0–15.0)
Hemoglobin: 11.4 g/dL — ABNORMAL LOW (ref 12.0–15.0)
MCH: 31.1 pg (ref 26.0–34.0)
MCH: 31.1 pg (ref 26.0–34.0)
MCHC: 32.7 g/dL (ref 30.0–36.0)
MCHC: 32.8 g/dL (ref 30.0–36.0)
MCV: 95.1 fL (ref 80.0–100.0)
MCV: 94.8 fL (ref 80.0–100.0)
Platelets: 197 10*3/uL (ref 150–400)
Platelets: 198 10*3/uL (ref 150–400)
RBC: 3.7 MIL/uL — ABNORMAL LOW (ref 3.87–5.11)
RBC: 3.67 MIL/uL — ABNORMAL LOW (ref 3.87–5.11)
RDW: 13.6 % (ref 11.5–15.5)
RDW: 13.7 % (ref 11.5–15.5)
WBC: 11.3 10*3/uL — ABNORMAL HIGH (ref 4.0–10.5)
WBC: 11.6 10*3/uL — ABNORMAL HIGH (ref 4.0–10.5)
nRBC: 0 % (ref 0.0–0.2)
nRBC: 0 % (ref 0.0–0.2)

## 2020-05-15 LAB — LACTIC ACID, PLASMA
Lactic Acid, Venous: 0.6 mmol/L (ref 0.5–1.9)
Lactic Acid, Venous: 0.6 mmol/L (ref 0.5–1.9)

## 2020-05-15 LAB — HEMOGLOBIN AND HEMATOCRIT, BLOOD
HCT: 39 % (ref 36.0–46.0)
HCT: 36.3 % (ref 36.0–46.0)
HCT: 37.1 % (ref 36.0–46.0)
Hemoglobin: 12.8 g/dL (ref 12.0–15.0)
Hemoglobin: 11.9 g/dL — ABNORMAL LOW (ref 12.0–15.0)
Hemoglobin: 12 g/dL (ref 12.0–15.0)

## 2020-05-15 LAB — TYPE AND SCREEN
ABO/RH(D): O POS
Antibody Screen: NEGATIVE

## 2020-05-15 LAB — SARS CORONAVIRUS 2 BY RT PCR (HOSPITAL ORDER, PERFORMED IN ~~LOC~~ HOSPITAL LAB): SARS Coronavirus 2: NEGATIVE

## 2020-05-15 LAB — OCCULT BLOOD X 1 CARD TO LAB, STOOL: Fecal Occult Bld: POSITIVE — AB

## 2020-05-15 MED ORDER — SODIUM CHLORIDE 0.9 % IV BOLUS
500.0000 mL | Freq: Once | INTRAVENOUS | Status: AC
  Administered 2020-05-15: 500 mL via INTRAVENOUS

## 2020-05-15 MED ORDER — PREGABALIN 75 MG PO CAPS
200.0000 mg | ORAL_CAPSULE | Freq: Every morning | ORAL | Status: DC
  Administered 2020-05-16 – 2020-05-17 (×2): 200 mg via ORAL
  Filled 2020-05-15: qty 2
  Filled 2020-05-15: qty 1
  Filled 2020-05-15: qty 2
  Filled 2020-05-15 (×2): qty 1
  Filled 2020-05-15: qty 2

## 2020-05-15 MED ORDER — ONDANSETRON HCL 4 MG/2ML IJ SOLN
4.0000 mg | Freq: Four times a day (QID) | INTRAMUSCULAR | Status: DC | PRN
  Administered 2020-05-17: 4 mg via INTRAVENOUS
  Filled 2020-05-15: qty 2

## 2020-05-15 MED ORDER — LABETALOL HCL 5 MG/ML IV SOLN
10.0000 mg | INTRAVENOUS | Status: DC | PRN
  Administered 2020-05-15 – 2020-05-16 (×2): 10 mg via INTRAVENOUS
  Filled 2020-05-15 (×2): qty 4

## 2020-05-15 MED ORDER — PREGABALIN 75 MG PO CAPS
400.0000 mg | ORAL_CAPSULE | Freq: Every day | ORAL | Status: DC
  Administered 2020-05-15 – 2020-05-16 (×2): 400 mg via ORAL
  Filled 2020-05-15 (×2): qty 5
  Filled 2020-05-15: qty 4
  Filled 2020-05-15: qty 5

## 2020-05-15 MED ORDER — IOHEXOL 300 MG/ML  SOLN
100.0000 mL | Freq: Once | INTRAMUSCULAR | Status: AC | PRN
  Administered 2020-05-15: 80 mL via INTRAVENOUS

## 2020-05-15 MED ORDER — SODIUM CHLORIDE 0.9 % IV SOLN
2.0000 g | INTRAVENOUS | Status: DC
  Administered 2020-05-15 – 2020-05-16 (×2): 2 g via INTRAVENOUS
  Filled 2020-05-15 (×2): qty 2
  Filled 2020-05-15: qty 20

## 2020-05-15 MED ORDER — SODIUM CHLORIDE 0.9 % IV SOLN: INTRAVENOUS | Status: AC

## 2020-05-15 MED ORDER — ROPINIROLE HCL 1 MG PO TABS
0.5000 mg | ORAL_TABLET | Freq: Two times a day (BID) | ORAL | Status: DC
  Administered 2020-05-15 – 2020-05-17 (×4): 0.5 mg via ORAL
  Filled 2020-05-15 (×4): qty 1

## 2020-05-15 MED ORDER — TRAZODONE HCL 50 MG PO TABS
50.0000 mg | ORAL_TABLET | Freq: Every evening | ORAL | Status: DC | PRN
  Administered 2020-05-15: 50 mg via ORAL
  Filled 2020-05-15: qty 1

## 2020-05-15 MED ORDER — PREGABALIN 100 MG PO CAPS
200.0000 mg | ORAL_CAPSULE | Freq: Two times a day (BID) | ORAL | Status: DC
  Filled 2020-05-15: qty 4

## 2020-05-15 MED ORDER — PANTOPRAZOLE SODIUM 40 MG IV SOLR
40.0000 mg | Freq: Two times a day (BID) | INTRAVENOUS | Status: DC
  Administered 2020-05-15 – 2020-05-16 (×5): 40 mg via INTRAVENOUS
  Filled 2020-05-15 (×5): qty 40

## 2020-05-15 MED ORDER — MORPHINE SULFATE (PF) 2 MG/ML IV SOLN
1.0000 mg | INTRAVENOUS | Status: DC | PRN
  Administered 2020-05-16: 1 mg via INTRAVENOUS
  Filled 2020-05-15: qty 1

## 2020-05-15 MED ORDER — SODIUM CHLORIDE 0.9 % IV SOLN
1.0000 g | INTRAVENOUS | Status: DC
  Administered 2020-05-15: 1 g via INTRAVENOUS
  Filled 2020-05-15 (×2): qty 10

## 2020-05-15 MED ORDER — MORPHINE SULFATE (PF) 4 MG/ML IV SOLN
4.0000 mg | INTRAVENOUS | Status: DC | PRN
  Administered 2020-05-15: 4 mg via INTRAVENOUS
  Filled 2020-05-15: qty 1

## 2020-05-15 MED ORDER — METRONIDAZOLE IN NACL 5-0.79 MG/ML-% IV SOLN
500.0000 mg | Freq: Three times a day (TID) | INTRAVENOUS | Status: DC
  Administered 2020-05-15 – 2020-05-17 (×7): 500 mg via INTRAVENOUS
  Filled 2020-05-15 (×8): qty 100

## 2020-05-15 MED ORDER — MORPHINE SULFATE (PF) 4 MG/ML IV SOLN
4.0000 mg | Freq: Once | INTRAVENOUS | Status: AC
  Administered 2020-05-15: 4 mg via INTRAVENOUS
  Filled 2020-05-15: qty 1

## 2020-05-15 MED ORDER — ONDANSETRON HCL 4 MG PO TABS
4.0000 mg | ORAL_TABLET | Freq: Four times a day (QID) | ORAL | Status: DC | PRN
  Filled 2020-05-15: qty 1

## 2020-05-15 MED ORDER — ONDANSETRON HCL 4 MG/2ML IJ SOLN
4.0000 mg | Freq: Once | INTRAMUSCULAR | Status: AC
  Administered 2020-05-15: 4 mg via INTRAVENOUS
  Filled 2020-05-15: qty 2

## 2020-05-15 MED ORDER — BACLOFEN 10 MG PO TABS
10.0000 mg | ORAL_TABLET | Freq: Four times a day (QID) | ORAL | Status: DC | PRN
  Administered 2020-05-15: 10 mg via ORAL
  Filled 2020-05-15: qty 1

## 2020-05-15 MED ORDER — LEVOTHYROXINE SODIUM 112 MCG PO TABS
112.0000 ug | ORAL_TABLET | Freq: Every day | ORAL | Status: DC
  Administered 2020-05-16 – 2020-05-17 (×2): 112 ug via ORAL
  Filled 2020-05-15 (×2): qty 1

## 2020-05-15 NOTE — H&P (Signed)
History and Physical    Stacy Stanley KXF:[id number] DOB: 01-22-1941 DOA: 05/15/2020  PCP: Lester Warfield., MD  Patient coming from: Home.  Chief Complaint: Rectal bleeding.  HPI: Stacy Stanley is a 79 y.o. female with history of hypertension, hypothyroidism, restless leg syndrome presents to the ER admits in [facility] with complaints of having multiple episodes of rectal bleeding with abdominal pain.  Patient states his symptoms started yesterday when she had some constipation for couple of days had a large bowel movement around the morning and it was bloody.  Following which patient started having abdominal crampy pain all over.  No vomiting.  Patient had at least 5 bloody bowel movements and presented to the ER admits in [facility].  No fever chills.  Denies taking any blood thinners or NSAIDs.  Has not had any recent travel or sick contacts.  ED Course: In the ER patient was hemodynamically stable labs are significant for hemoglobin of 12.7 which dropped to 11.9.  1.1 WBC 12.4 CT scan was showing wall thickening of the left transverse colon at the distal aspect and splenic flexure.  Concerning for colitis.  Patient was started on empiric antibiotics.  IV fluids.  Patient admitted for acute GI bleed and colitis.  Covid test was negative.  Review of Systems: As per HPI, rest all negative.   Past Medical History:  Diagnosis Date  . Anemia   . Anxiety   . Arthritis   . Complication of anesthesia   . Depression   . Difficult intubation    fiberoptic intubation in the setting of C2 fx 08/29/12; now s/p C1-2 and T2-8 fusion (has limited neck ROM)  . GERD (gastroesophageal reflux disease)   . Headache   . High cholesterol    controlled with Atorvastatin  . History of bronchitis   . History of pneumonia   . Hypertension   . Hyponatremia    states that sodium level drops after surgery  . Hypothyroidism   . Numbness and tingling    hands and feet  . Peripheral neuropathy   . Restless  leg syndrome   . Urinary incontinence   . Urinary urgency     Past Surgical History:  Procedure Laterality Date  . ABDOMINAL HYSTERECTOMY    . BREAST LUMPECTOMY Left   . CERVICAL FUSION    . COLONOSCOPY    . EYE SURGERY Bilateral    cataracts  . KNEE ARTHROSCOPY Bilateral   . LIGAMENT REPAIR Left 10/05/2017   Procedure: LIGAMENT REPAIR LEFT KNEE RETINACULAR REPAIR;  Surgeon: Marcene Corning, MD;  Location: [facility] OR;  Service: Orthopedics;  Laterality: Left;  Marland Kitchen Medtronic Bladder stimulator insertion    . SPINAL FUSION     posterior  . TONSILLECTOMY    . TOTAL KNEE ARTHROPLASTY Right 08/04/2016   Procedure: TOTAL KNEE ARTHROPLASTY;  Surgeon: Marcene Corning, MD;  Location: [facility] OR;  Service: Orthopedics;  Laterality: Right;  . TOTAL KNEE ARTHROPLASTY Left 03/02/2017   Procedure: TOTAL KNEE ARTHROPLASTY;  Surgeon: Marcene Corning, MD;  Location: [facility] OR;  Service: Orthopedics;  Laterality: Left;     reports that she has never smoked. She has never used smokeless tobacco. She reports that she does not drink alcohol and does not use drugs.  Allergies  Allergen Reactions  . Adhesive [Tape] Itching and Rash  . Ciprofloxacin Rash    Family History  Family history unknown: Yes    Prior to Admission medications   Medication Sig Start Date End Date Taking? Authorizing  Provider  atorvastatin (LIPITOR) 20 MG tablet Take 20 mg by mouth daily with supper.   Yes [provider]  baclofen (LIORESAL) 10 MG tablet Take 10 mg by mouth 4 (four) times daily as needed for muscle spasms.   Yes [provider]  Cholecalciferol (VITAMIN D3) 2000 units capsule Take 2,000 Units by mouth daily.   Yes [provider]  docusate sodium (COLACE) 100 MG capsule Take 200 mg by mouth daily.   Yes [provider]  ferrous sulfate 325 (65 FE) MG EC tablet Take 325 mg by mouth daily.   Yes [provider]  hydrALAZINE (APRESOLINE) 50 MG tablet Take 50 mg by mouth 3 (three)  times daily.   Yes [provider]  labetalol (NORMODYNE) 300 MG tablet Take 300 mg by mouth 3 (three) times daily.   Yes [provider]  levothyroxine (SYNTHROID, LEVOTHROID) 112 MCG tablet Take 112 mcg by mouth daily before breakfast.   Yes [provider]  losartan (COZAAR) 100 MG tablet Take 100 mg by mouth daily.   Yes [provider]  Multiple Vitamin (MULTIVITAMIN WITH MINERALS) TABS tablet Take 1 tablet by mouth daily.   Yes [provider]  phenazopyridine (PYRIDIUM) 200 MG tablet Take 200 mg by mouth 3 (three) times daily as needed for pain.   Yes [provider]  pregabalin (LYRICA) 200 MG capsule Take 200-400 mg by mouth 2 (two) times daily. Take 200mg  in the morning and 400mg s at night   Yes [provider]  rizatriptan (MAXALT-MLT) 5 MG disintegrating tablet Take 5 mg by mouth as needed for migraine. May repeat in 2 hours if needed   Yes [provider]  rOPINIRole (REQUIP) 0.25 MG tablet Take 0.25-0.5 mg by mouth See admin instructions. Take 0.25mg s at 3pm, 0.25mg s at dinner, and 0.5mg s at bedtime    Yes [provider]  tolterodine (DETROL LA) 4 MG 24 hr capsule Take 4 mg by mouth daily with supper.   Yes [provider]  topiramate (TOPAMAX) 100 MG tablet Take 100 mg by mouth as needed (migraines).    Yes [provider]  traMADol (ULTRAM-ER) 300 MG 24 hr tablet Take 300 mg by mouth daily.   Yes [provider]  traZODone (DESYREL) 50 MG tablet Take 150 mg by mouth at bedtime.    Yes [provider]  hydrochlorothiazide (HYDRODIURIL) 12.5 MG tablet TAKE 1 TABLET ON MONDAY, [facility] AND FRIDAY 04/22/20   [provider]  hydrocortisone 2.5 % cream Apply topically. 04/11/20   [provider]  ketoconazole (NIZORAL) 2 % cream SMARTSIG:1 Topical Every Night 04/11/20   [provider]  Olopatadine HCl 0.2 % SOLN INSTILL 1 DROP INTO BOTH EYES EVERY  DAY 03/05/20   [provider]    Physical Exam: Constitutional: Moderately built and nourished. Vitals:   05/15/20 1320 05/15/20 1622 05/15/20 1818 05/15/20 1929  BP: (!) 152/61 (!) 141/86 (!) 167/72 (!) 179/67  Pulse: 68 69 92 72  Resp:  18  18  Temp:   98.6 F (37 C) 98.6 F (37 C)  TempSrc:   Oral Oral  SpO2: 94% 91% 93% 93%  Weight:      Height:       Eyes: Anicteric no pallor. ENMT: No discharge from the ears eyes nose or mouth. Neck: No mass or.  No neck rigidity.  Respiratory: No rhonchi or crepitations. Cardiovascular: S1-S2 heard. Abdomen: Soft nontender bowel sounds present. Musculoskeletal: No edema. Skin:  Ecchymosis around the eyes. Neurologic: Alert awake oriented to time place and person.  Moves all extremities. Psychiatric: Appears normal.  Normal affect.   Labs on Admission: I have personally reviewed following labs and imaging studies  CBC: Recent Labs  Lab 05/14/20 1947 05/15/20 0114 05/15/20 0259 05/15/20 1100  WBC 12.4*  --   --   --   HGB 12.7 12.8 11.9* 12.0  HCT 39.7 39.0 36.3 37.1  MCV 93.2  --   --   --   PLT 269  --   --   --    Basic Metabolic Panel: Recent Labs  Lab 05/14/20 1947  NA 134*  K 4.6  CL 98  CO2 27  GLUCOSE 129*  BUN 48*  CREATININE 1.18*  CALCIUM 9.5   GFR: Estimated Creatinine Clearance: 38.2 mL/min (A) (by C-G formula based on SCr of 1.18 mg/dL (H)). Liver Function Tests: Recent Labs  Lab 05/14/20 1947  AST 19  ALT 21  ALKPHOS 68  BILITOT 0.2*  PROT 7.2  ALBUMIN 4.2   No results for input(s): LIPASE, AMYLASE in the last 168 hours. No results for input(s): AMMONIA in the last 168 hours. Coagulation Profile: No results for input(s): INR, PROTIME in the last 168 hours. Cardiac Enzymes: No results for input(s): CKTOTAL, CKMB, CKMBINDEX, TROPONINI in the last 168 hours. BNP (last 3 results) No results for input(s): PROBNP in the last 8760 hours. HbA1C: No results for input(s): HGBA1C in the  last 72 hours. CBG: No results for input(s): GLUCAP in the last 168 hours. Lipid Profile: No results for input(s): CHOL, HDL, LDLCALC, TRIG, CHOLHDL, LDLDIRECT in the last 72 hours. Thyroid Function Tests: No results for input(s): TSH, T4TOTAL, FREET4, T3FREE, THYROIDAB in the last 72 hours. Anemia Panel: No results for input(s): VITAMINB12, FOLATE, FERRITIN, TIBC, IRON, RETICCTPCT in the last 72 hours. Urine analysis:    Component Value Date/Time   COLORURINE YELLOW 02/23/2017 1416   APPEARANCEUR CLEAR 02/23/2017 1416   LABSPEC 1.015 02/23/2017 1416   PHURINE 6.0 02/23/2017 1416   GLUCOSEU NEGATIVE 02/23/2017 1416   HGBUR NEGATIVE 02/23/2017 1416   BILIRUBINUR NEGATIVE 02/23/2017 1416   KETONESUR NEGATIVE 02/23/2017 1416   PROTEINUR NEGATIVE 02/23/2017 1416   NITRITE NEGATIVE 02/23/2017 1416   LEUKOCYTESUR TRACE (A) 02/23/2017 1416   Sepsis Labs: @LABRCNTIP (procalcitonin:4,lacticidven:4) ) Recent Results (from the past 240 hour(s))  SARS Coronavirus 2 by RT PCR (hospital order, performed in [facility] hospital lab) Nasopharyngeal Nasopharyngeal Swab     Status: None   Collection Time: 05/15/20  2:13 AM   Specimen: Nasopharyngeal Swab  Result Value Ref Range Status   SARS Coronavirus 2 NEGATIVE NEGATIVE Final    Comment: (NOTE) SARS-CoV-2 target nucleic acids are NOT DETECTED.  The SARS-CoV-2 RNA is generally detectable in upper and lower respiratory specimens during the acute phase of infection. The lowest concentration of SARS-CoV-2 viral copies this assay can detect is 250 copies / mL. A negative result does not preclude SARS-CoV-2 infection and should not be used as the sole basis for treatment or other patient management decisions.  A negative result may occur with improper specimen collection / handling, submission of specimen other than nasopharyngeal swab, presence of viral mutation(s) within the areas targeted by this assay, and inadequate number of viral  copies (<250 copies / mL). A negative result must be combined with clinical observations, patient history, and epidemiological information.  Fact Sheet for Patients:   07/15/20  Fact Sheet for Healthcare Providers: BoilerBrush.com.cy  This test is not yet approved or  cleared by the [place] FDA and has been authorized for detection and/or diagnosis of SARS-CoV-2 by FDA under an Emergency Use Authorization (EUA).  This EUA will remain in effect (meaning this test can be used) for the duration of the COVID-19 declaration under Section 564(b)(1) of the Act, 21 U.S.C. section 360bbb-3(b)(1), unless the authorization is terminated or revoked sooner.  Performed at [facility] [place], [address] [place], [place] [address]      Radiological Exams on Admission: CT ABDOMEN PELVIS W CONTRAST  Result Date: 05/15/2020 CLINICAL DATA:  Right red blood per rectum EXAM: CT ABDOMEN AND PELVIS WITH CONTRAST TECHNIQUE: Multidetector CT imaging of the abdomen and pelvis was performed using the standard protocol following bolus administration of intravenous contrast. CONTRAST:  80mL OMNIPAQUE IOHEXOL 300 MG/ML  SOLN COMPARISON:  None. FINDINGS: Lower chest: No acute abnormality. Hepatobiliary: No focal hepatic abnormality. Gallbladder unremarkable. Pancreas: No focal abnormality or ductal dilatation. Spleen: No focal abnormality.  Normal size. Adrenals/Urinary Tract: No adrenal abnormality. No focal renal abnormality. No stones or hydronephrosis. Urinary bladder is unremarkable. Stomach/Bowel: Descending colonic and sigmoid diverticulosis. There is wall thickening within the distal transverse colon and splenic flexure. Mild surrounding stranding. Findings suspicious for colitis. Stomach and small bowel decompressed, unremarkable. Appendix is normal. Vascular/Lymphatic: Aortic atherosclerosis. No evidence of aneurysm or adenopathy.  Reproductive: Prior hysterectomy.  No adnexal masses. Other: No free fluid or free air. Musculoskeletal: No acute bony abnormality. IMPRESSION: Wall thickening within the distal transverse colon and splenic flexure concerning for colitis. Left colonic diverticulosis. Aortic atherosclerosis. Electronically Signed   By: Charlett NoseKevin  Dover M.D.   On: 05/15/2020 01:50     Assessment/Plan Principal Problem:   GI bleed Active Problems:   Colitis   Acute blood loss anemia   Essential hypertension   Acute GI bleeding    1. Acute GI bleed with colitis -we will check lactic acid to rule out any signs of ischemic colitis.  Patient on empiric antibiotics for possible infectious colitis.  No previous history of any inflammatory bowel disease.  CT scan does show diverticulosis.  Patient states she did [place] [facility] [place].  For now we will keep patient on clear liquids and hydrate and pain relief medication.  GI has been consulted.  Continue antibiotics.  Follow CBC. 2. Acute blood loss anemia follow CBC. 3. Acute renal failure likely from blood loss.  Gently hydrate and hold hydrochlorothiazide. 4. Hypertension on labetalol and hydralazine.  Holding hydrochlorothiazide due to dehydration. 5. Hypothyroidism on Synthroid. 6. History of restless leg syndrome on Requip and baclofen. 7. Patient has ecchymotic areas on the face from fall last month.  Since patient has abdominal pain with colitis and GI bleed with acute renal failure will need close monitoring for any further worsening in inpatient status.   DVT prophylaxis: SCDs.  Avoiding anticoagulation due to GI bleed. Code Status: Full code. Family Communication: Discussed with patient. Disposition Plan: Home. Consults called: [facility]. Admission status: Inpatient.   Eduard ClosArshad N Oleva Koo MD [facility] Pager [phone].  If 7PM-7AM, please contact night-coverage [email or web address] Password [email or web address]  05/15/2020, 8:05 PM

## 2020-05-15 NOTE — ED Notes (Signed)
Pt cleaned and changed  Prepped for  [facility]

## 2020-05-15 NOTE — ED Notes (Signed)
Pt. Reports bright red stool x 5 today and the last one was less red.  Pt. Reports no black stools.  Pt. Reports she had lower abd. Pain started yesterday.

## 2020-05-15 NOTE — Plan of Care (Signed)
Plan of care discussed.   

## 2020-05-15 NOTE — ED Notes (Signed)
Called pharmacist at [facility] to send Lyrica. We do not have it here.

## 2020-05-15 NOTE — ED Notes (Signed)
Pt given ice water.

## 2020-05-15 NOTE — ED Notes (Signed)
Husband in room  No c/o of anything

## 2020-05-15 NOTE — ED Notes (Addendum)
Pt s husband in room pt [id number]  Attitude is great states loves the room it is quiet

## 2020-05-15 NOTE — ED Provider Notes (Signed)
[facility] EMERGENCY DEPARTMENT Provider Note   CSN: [id number] Arrival date & time: 05/14/20  1907     History Chief Complaint  Patient presents with  . GI Bleeding    Stacy Stanley is a 79 y.o. female.  HPI     This is a 79 year old female with a history of hypertension, peripheral neuropathy, hypothyroidism who presents with abdominal pain and bright blood blood per rectum.  Patient reports that she went to the bathroom first yesterday around lunchtime.  She noted a significant amount of blood along with her bowel movement.  She had previously been constipated and took a stool softener.  She states she did not take anything else or any laxative.  She subsequently had 2 additional bowel movements that had less stool in them but continued to be grossly bloody filling the toilet.  She denies any dizziness.  She does report some crampy lower abdominal pain.  She states that it is "not too bad right now" but was worse at home and had her doubled over.  Current pain is 4 out of 10.  She is not had any vomiting or hematemesis.  No known history of GI bleeds and is not on any blood thinners.  She does not currently have a gastroenterologist and she reports that she is due for colonoscopy.  Past Medical History:  Diagnosis Date  . Anemia   . Anxiety   . Arthritis   . Complication of anesthesia   . Depression   . Difficult intubation    fiberoptic intubation in the setting of C2 fx 08/29/12; now s/p C1-2 and T2-8 fusion (has limited neck ROM)  . GERD (gastroesophageal reflux disease)   . Headache   . High cholesterol    controlled with Atorvastatin  . History of bronchitis   . History of pneumonia   . Hypertension   . Hyponatremia    states that sodium level drops after surgery  . Hypothyroidism   . Numbness and tingling    hands and feet  . Peripheral neuropathy   . Restless leg syndrome   . Urinary incontinence   . Urinary urgency     Patient Active Problem List     Diagnosis Date Noted  . Left knee pain 10/05/2017  . 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 Primary osteoarthritis of right knee 08/04/2016    Past Surgical History:  Procedure Laterality Date  . ABDOMINAL HYSTERECTOMY    . BREAST LUMPECTOMY Left   . CERVICAL FUSION    . COLONOSCOPY    . EYE SURGERY Bilateral    cataracts  . KNEE ARTHROSCOPY Bilateral   . LIGAMENT REPAIR Left 10/05/2017   Procedure: LIGAMENT REPAIR LEFT KNEE RETINACULAR REPAIR;  Surgeon: Marcene Corningalldorf, Peter, MD;  Location: [facility] OR;  Service: Orthopedics;  Laterality: Left;  Marland Kitchen. Medtronic Bladder stimulator insertion    . SPINAL FUSION     posterior  . TONSILLECTOMY    . TOTAL KNEE ARTHROPLASTY Right 08/04/2016   Procedure: TOTAL KNEE ARTHROPLASTY;  Surgeon: Marcene CorningPeter Dalldorf, MD;  Location: [facility] OR;  Service: Orthopedics;  Laterality: Right;  . TOTAL KNEE ARTHROPLASTY Left 03/02/2017   Procedure: TOTAL KNEE ARTHROPLASTY;  Surgeon: Marcene Corningalldorf, Peter, MD;  Location: [facility] OR;  Service: Orthopedics;  Laterality: Left;     OB History   No obstetric history on file.     No family history on file.  Social History   Tobacco Use  . Smoking status: Never Smoker  . Smokeless tobacco:  Never Used  Vaping Use  . Vaping Use: Never used  Substance Use Topics  . Alcohol use: No  . Drug use: No    Home Medications Prior to Admission medications   Medication Sig Start Date End Date Taking? Authorizing Provider  atorvastatin (LIPITOR) 20 MG tablet Take 20 mg by mouth daily with supper.    [provider]  baclofen (LIORESAL) 10 MG tablet Take 10 mg by mouth 4 (four) times daily as needed for muscle spasms.    [provider]  busPIRone (BUSPAR) 10 MG tablet Take 10 mg by mouth 2 (two) times daily.     [provider]  celecoxib (CELEBREX) 200 MG capsule Take 200 mg by mouth daily.    [provider]  Cholecalciferol (VITAMIN D3) 2000 units capsule Take 2,000 Units by mouth daily.     [provider]  docusate sodium (COLACE) 100 MG capsule Take 200 mg by mouth daily.    [provider]  ferrous sulfate 325 (65 FE) MG EC tablet Take 325 mg by mouth daily.    [provider]  Glucosamine-Chondroit-Vit C-Mn (GLUCOSAMINE CHONDR 1500 COMPLX) CAPS Take 1 capsule by mouth daily.    [provider]  hydrALAZINE (APRESOLINE) 50 MG tablet Take 50 mg by mouth 3 (three) times daily.    [provider]  HYDROcodone-acetaminophen (NORCO/VICODIN) 5-325 MG tablet Take 1-2 tablets by mouth every 4 (four) hours as needed for moderate pain. 10/06/17   Elodia Florence, PA-C  labetalol (NORMODYNE) 300 MG tablet Take 300 mg by mouth 3 (three) times daily.    [provider]  levothyroxine (SYNTHROID, LEVOTHROID) 112 MCG tablet Take 112 mcg by mouth daily before breakfast.    [provider]  losartan (COZAAR) 100 MG tablet Take 100 mg by mouth daily.    [provider]  montelukast (SINGULAIR) 10 MG tablet Take 10 mg by mouth at bedtime.    [provider]  Multiple Vitamin (MULTIVITAMIN WITH MINERALS) TABS tablet Take 1 tablet by mouth daily.    [provider]  Multiple Vitamins-Minerals (HAIR/SKIN/NAILS) TABS Take 1 tablet by mouth daily.    [provider]  Omega-3 Fatty Acids (FISH OIL) 1000 MG CAPS Take 1,000 mg by mouth daily.    [provider]  omeprazole (PRILOSEC) 40 MG capsule Take 40 mg by mouth daily.    [provider]  pregabalin (LYRICA) 200 MG capsule Take 200-400 mg by mouth 2 (two) times daily. Take 200mg  in the morning and 400mg s at night    [provider]  rOPINIRole (REQUIP) 0.25 MG tablet Take 0.25-0.5 mg by mouth See admin instructions. Take 0.25mg s at 3pm, 0.25mg s at dinner, and 0.5mg s at bedtime     [provider]  tolterodine (DETROL LA) 4 MG 24 hr capsule Take 4 mg by mouth daily with supper.    [provider]  topiramate (TOPAMAX)  100 MG tablet Take 100 mg by mouth at bedtime.     [provider]  traZODone (DESYREL) 50 MG tablet Take 50-150 mg by mouth at bedtime.     [provider]  venlafaxine XR (EFFEXOR-XR) 75 MG 24 hr capsule Take 75 mg by mouth 2 (two) times daily.     [provider]    Allergies    Adhesive [tape] and Ciprofloxacin  Review of Systems   Review of Systems  Constitutional: Negative for fever.  Respiratory: Negative for shortness of breath.   Cardiovascular: Negative  for chest pain.  Gastrointestinal: Positive for abdominal pain and blood in stool. Negative for diarrhea and nausea.  Genitourinary: Negative for dysuria.  All other systems reviewed and are negative.   Physical Exam Updated Vital Signs BP (!) 163/66   Pulse 67   Temp 98.1 F (36.7 C) (Oral)   Resp 20   Ht 1.499 m (4\' 11" )   Wt 86.6 kg   SpO2 96%   BMI 38.58 kg/m   Physical Exam Vitals and nursing note reviewed.  Constitutional:      Appearance: She is well-developed. She is obese. She is not ill-appearing.  HENT:     Head: Normocephalic and atraumatic.     Nose: Nose normal.     Mouth/Throat:     Mouth: Mucous membranes are moist.  Eyes:     Pupils: Pupils are equal, round, and reactive to light.  Cardiovascular:     Rate and Rhythm: Normal rate and regular rhythm.     Heart sounds: Normal heart sounds.  Pulmonary:     Effort: Pulmonary effort is normal. No respiratory distress.     Breath sounds: No wheezing.  Abdominal:     General: Bowel sounds are normal.     Palpations: Abdomen is soft.     Tenderness: There is abdominal tenderness.     Comments: Lower abdominal tenderness to palpation, no rebound or guarding  Genitourinary:    Rectum: Guaiac result positive.     Comments: No gross blood per rectum Musculoskeletal:     Cervical back: Neck supple.  Skin:    General: Skin is warm and dry.  Neurological:     Mental Status: She is alert and oriented to person, place,  and time.  Psychiatric:        Mood and Affect: Mood normal.     ED Results / Procedures / Treatments   Labs (all labs ordered are listed, but only abnormal results are displayed) Labs Reviewed  COMPREHENSIVE METABOLIC PANEL - Abnormal; Notable for the following components:      Result Value   Sodium 134 (*)    Glucose, Bld 129 (*)    BUN 48 (*)    Creatinine, Ser 1.18 (*)    Total Bilirubin 0.2 (*)    GFR calc non Af Amer 44 (*)    GFR calc Af Amer 52 (*)    All other components within normal limits  CBC - Abnormal; Notable for the following components:   WBC 12.4 (*)    All other components within normal limits  OCCULT BLOOD X 1 CARD TO LAB, STOOL - Abnormal; Notable for the following components:   Fecal Occult Bld POSITIVE (*)    All other components within normal limits  SARS CORONAVIRUS 2 BY RT PCR (HOSPITAL ORDER, PERFORMED IN [facility])  HEMOGLOBIN AND HEMATOCRIT, BLOOD    EKG None  Radiology CT ABDOMEN PELVIS W CONTRAST  Result Date: 05/15/2020 CLINICAL DATA:  Right red blood per rectum EXAM: CT ABDOMEN AND PELVIS WITH CONTRAST TECHNIQUE: Multidetector CT imaging of the abdomen and pelvis was performed using the standard protocol following bolus administration of intravenous contrast. CONTRAST:  75mL OMNIPAQUE IOHEXOL 300 MG/ML  SOLN COMPARISON:  None. FINDINGS: Lower chest: No acute abnormality. Hepatobiliary: No focal hepatic abnormality. Gallbladder unremarkable. Pancreas: No focal abnormality or ductal dilatation. Spleen: No focal abnormality.  Normal size. Adrenals/Urinary Tract: No adrenal abnormality. No focal renal abnormality. No stones or hydronephrosis. Urinary bladder is unremarkable. Stomach/Bowel: Descending colonic  and sigmoid diverticulosis. There is wall thickening within the distal transverse colon and splenic flexure. Mild surrounding stranding. Findings suspicious for colitis. Stomach and small bowel decompressed, unremarkable. Appendix is  normal. Vascular/Lymphatic: Aortic atherosclerosis. No evidence of aneurysm or adenopathy. Reproductive: Prior hysterectomy.  No adnexal masses. Other: No free fluid or free air. Musculoskeletal: No acute bony abnormality. IMPRESSION: Wall thickening within the distal transverse colon and splenic flexure concerning for colitis. Left colonic diverticulosis. Aortic atherosclerosis. Electronically Signed   By: Charlett Nose M.D.   On: 05/15/2020 01:50    Procedures Procedures (including critical care time)  Medications Ordered in ED Medications  sodium chloride 0.9 % bolus 500 mL (500 mLs Intravenous New Bag/Given 05/15/20 0126)  morphine 4 MG/ML injection 4 mg (4 mg Intravenous Given 05/15/20 0127)  ondansetron (ZOFRAN) injection 4 mg (4 mg Intravenous Given 05/15/20 0127)  iohexol (OMNIPAQUE) 300 MG/ML solution 100 mL (80 mLs Intravenous Contrast Given 05/15/20 0134)    ED Course  I have reviewed the triage vital signs and the nursing notes.  Pertinent labs & imaging results that were available during my care of the patient were reviewed by me and considered in my medical decision making (see chart for details).    MDM Rules/Calculators/A&P                           Patient presents with abdominal pain.  Reports bloody stools at home.  No history of the same.  She is overall nontoxic and vital signs are reassuring.  She is clinically stable.  No active bleeding noted on rectal exam.  She is Hemoccult positive.  Given her abdominal pain, would have some concern for ischemic etiology.  Other considerations include diverticular bleed or infectious etiology.  She does have a leukocytosis to 12.  Hemoglobin initially 12.  Repeat obtained and stable.  She is not had any active bleeding in the ED.  CT scan was obtained to further delineate potential cause.  CT scan is concerning for colitis.  Again would have some concern for ischemic etiology.  Patient remains hemodynamically stable.  Will admit to the  hospitalist for observation and GI consultation.  Patient was covered with Rocephin and Flagyl as she has a ciprofloxacin allergy.  Additionally, message sent to GI doctor on-call, Dr. Chales Abrahams for nonemergent evaluation upon arrival to [facility].   Final Clinical Impression(s) / ED Diagnoses Final diagnoses:  Colitis  Gastrointestinal hemorrhage, unspecified gastrointestinal hemorrhage type    Rx / DC Orders ED Discharge Orders    None       Shon Baton, MD 05/15/20 0301

## 2020-05-15 NOTE — ED Notes (Signed)
Husband at bedside  Gave pts her lyrica and her requip  That he brought from home , pt given ginger ale and pt washed her face and hands , pt pulled up in bed

## 2020-05-16 DIAGNOSIS — K529 Noninfective gastroenteritis and colitis, unspecified: Secondary | ICD-10-CM

## 2020-05-16 DIAGNOSIS — I10 Essential (primary) hypertension: Secondary | ICD-10-CM

## 2020-05-16 DIAGNOSIS — D62 Acute posthemorrhagic anemia: Secondary | ICD-10-CM

## 2020-05-16 DIAGNOSIS — K922 Gastrointestinal hemorrhage, unspecified: Secondary | ICD-10-CM

## 2020-05-16 LAB — BASIC METABOLIC PANEL
Anion gap: 7 (ref 5–15)
BUN: 23 mg/dL (ref 8–23)
CO2: 24 mmol/L (ref 22–32)
Calcium: 8.8 mg/dL — ABNORMAL LOW (ref 8.9–10.3)
Chloride: 105 mmol/L (ref 98–111)
Creatinine, Ser: 0.8 mg/dL (ref 0.44–1.00)
GFR calc Af Amer: >60 mL/min (ref >60)
GFR calc non Af Amer: >60 mL/min (ref >60)
Glucose, Bld: 90 mg/dL (ref 70–99)
Potassium: 3.9 mmol/L (ref 3.5–5.1)
Sodium: 136 mmol/L (ref 135–145)

## 2020-05-16 LAB — CBC
HCT: 35.4 % — ABNORMAL LOW (ref 36.0–46.0)
Hemoglobin: 11.7 g/dL — ABNORMAL LOW (ref 12.0–15.0)
MCH: 31.3 pg (ref 26.0–34.0)
MCHC: 33.1 g/dL (ref 30.0–36.0)
MCV: 94.7 fL (ref 80.0–100.0)
Platelets: 193 10*3/uL (ref 150–400)
RBC: 3.74 MIL/uL — ABNORMAL LOW (ref 3.87–5.11)
RDW: 13.5 % (ref 11.5–15.5)
WBC: 9.5 10*3/uL (ref 4.0–10.5)
nRBC: 0 % (ref 0.0–0.2)

## 2020-05-16 MED ORDER — HYDRALAZINE HCL 50 MG PO TABS
50.0000 mg | ORAL_TABLET | Freq: Three times a day (TID) | ORAL | Status: DC
  Administered 2020-05-16 – 2020-05-17 (×5): 50 mg via ORAL
  Filled 2020-05-16 (×5): qty 1

## 2020-05-16 MED ORDER — PEG 3350-KCL-NA BICARB-NACL 420 G PO SOLR
4000.0000 mL | Freq: Once | ORAL | Status: AC
  Administered 2020-05-16: 4000 mL via ORAL

## 2020-05-16 MED ORDER — SODIUM CHLORIDE 0.9 % IV SOLN: INTRAVENOUS | Status: DC

## 2020-05-16 MED ORDER — LABETALOL HCL 300 MG PO TABS
300.0000 mg | ORAL_TABLET | Freq: Two times a day (BID) | ORAL | Status: DC
  Administered 2020-05-16 – 2020-05-17 (×3): 300 mg via ORAL
  Filled 2020-05-16 (×3): qty 1

## 2020-05-16 MED ORDER — OXYCODONE HCL 5 MG PO TABS
5.0000 mg | ORAL_TABLET | ORAL | Status: DC | PRN
  Administered 2020-05-16: 5 mg via ORAL
  Administered 2020-05-17 (×3): 10 mg via ORAL
  Filled 2020-05-16 (×3): qty 2
  Filled 2020-05-16: qty 1

## 2020-05-16 NOTE — H&P (View-Only) (Signed)
Reason for Consult: Colitis Referring Physician: [facility]  Stacy Stanley HPI: This is a 79 year old female with a PMH of GERD, HTN, peripheral neuropathy, anemia, and anxiety admitted for hematochezia.  The patient is currently very sedated and she is not able to stay awake to provide any history.  The history obtained from the chart states that she experienced abdominal pain and hematochezia.  She denies having a colonoscopy in the past.  There was no record of any fever on admission.  In total she had 5 bloody bowel movements.  There is no history of NSAID use and her HGB is relatively stable in the 11 range.  Past Medical History:  Diagnosis Date  . Anemia   . Anxiety   . Arthritis   . Complication of anesthesia   . Depression   . Difficult intubation    fiberoptic intubation in the setting of C2 fx 08/29/12; now s/p C1-2 and T2-8 fusion (has limited neck ROM)  . GERD (gastroesophageal reflux disease)   . Headache   . High cholesterol    controlled with Atorvastatin  . History of bronchitis   . History of pneumonia   . Hypertension   . Hyponatremia    states that sodium level drops after surgery  . Hypothyroidism   . Numbness and tingling    hands and feet  . Peripheral neuropathy   . Restless leg syndrome   . Urinary incontinence   . Urinary urgency     Past Surgical History:  Procedure Laterality Date  . ABDOMINAL HYSTERECTOMY    . BREAST LUMPECTOMY Left   . CERVICAL FUSION    . COLONOSCOPY    . EYE SURGERY Bilateral    cataracts  . KNEE ARTHROSCOPY Bilateral   . LIGAMENT REPAIR Left 10/05/2017   Procedure: LIGAMENT REPAIR LEFT KNEE RETINACULAR REPAIR;  Surgeon: Dalldorf, Peter, MD;  Location: [facility] OR;  Service: Orthopedics;  Laterality: Left;  . Medtronic Bladder stimulator insertion    . SPINAL FUSION     posterior  . TONSILLECTOMY    . TOTAL KNEE ARTHROPLASTY Right 08/04/2016   Procedure: TOTAL KNEE ARTHROPLASTY;  Surgeon: Peter Dalldorf, MD;  Location:  [facility] OR;  Service: Orthopedics;  Laterality: Right;  . TOTAL KNEE ARTHROPLASTY Left 03/02/2017   Procedure: TOTAL KNEE ARTHROPLASTY;  Surgeon: Dalldorf, Peter, MD;  Location: [facility] OR;  Service: Orthopedics;  Laterality: Left;    Family History  Family history unknown: Yes    Social History:  reports that she has never smoked. She has never used smokeless tobacco. She reports that she does not drink alcohol and does not use drugs.  Allergies:  Allergies  Allergen Reactions  . Adhesive [Tape] Itching and Rash  . Ciprofloxacin Rash    Medications:  Scheduled: . hydrALAZINE  50 mg Oral Q8H  . labetalol  300 mg Oral BID  . levothyroxine  112 mcg Oral QAC breakfast  . pantoprazole (PROTONIX) IV  40 mg Intravenous Q12H  . pregabalin  200 mg Oral q AM   And  . pregabalin  400 mg Oral QHS  . rOPINIRole  0.5 mg Oral BID   Continuous: . sodium chloride 75 mL/hr at 05/16/20 0916  . cefTRIAXone (ROCEPHIN)  IV Stopped (05/15/20 2226)  . metronidazole 500 mg (05/16/20 1409)    Results for orders placed or performed during the hospital encounter of 05/15/20 (from the past 24 hour(s))  Hemoglobin and hematocrit, blood     Status: None   Collection Time:   05/15/20 11:00 AM  Result Value Ref Range   Hemoglobin 12.0 12.0 - 15.0 g/dL   HCT 37.1 36 - 46 %  CBC     Status: Abnormal   Collection Time: 05/15/20  8:44 PM  Result Value Ref Range   WBC 11.3 (H) 4.0 - 10.5 K/uL   RBC 3.70 (L) 3.87 - 5.11 MIL/uL   Hemoglobin 11.5 (L) 12.0 - 15.0 g/dL   HCT 35.2 (L) 36 - 46 %   MCV 95.1 80.0 - 100.0 fL   MCH 31.1 26.0 - 34.0 pg   MCHC 32.7 30.0 - 36.0 g/dL   RDW 13.6 11.5 - 15.5 %   Platelets 197 150 - 400 K/uL   nRBC 0.0 0.0 - 0.2 %  Lactic acid, plasma     Status: None   Collection Time: 05/15/20  8:44 PM  Result Value Ref Range   Lactic Acid, Venous 0.6 0.5 - 1.9 mmol/L  Type and screen [facility]     Status: None   Collection Time: 05/15/20  8:44 PM  Result Value Ref  Range   ABO/RH(D) O POS    Antibody Screen NEG    Sample Expiration      05/18/2020,2359 Performed at [facility], [address] [place], [place] [address]   CBC     Status: Abnormal   Collection Time: 05/15/20 10:46 PM  Result Value Ref Range   WBC 11.6 (H) 4.0 - 10.5 K/uL   RBC 3.67 (L) 3.87 - 5.11 MIL/uL   Hemoglobin 11.4 (L) 12.0 - 15.0 g/dL   HCT 34.8 (L) 36 - 46 %   MCV 94.8 80.0 - 100.0 fL   MCH 31.1 26.0 - 34.0 pg   MCHC 32.8 30.0 - 36.0 g/dL   RDW 13.7 11.5 - 15.5 %   Platelets 198 150 - 400 K/uL   nRBC 0.0 0.0 - 0.2 %  Lactic acid, plasma     Status: None   Collection Time: 05/15/20 10:46 PM  Result Value Ref Range   Lactic Acid, Venous 0.6 0.5 - 1.9 mmol/L  Basic metabolic panel     Status: Abnormal   Collection Time: 05/16/20  4:56 AM  Result Value Ref Range   Sodium 136 135 - 145 mmol/L   Potassium 3.9 3.5 - 5.1 mmol/L   Chloride 105 98 - 111 mmol/L   CO2 24 22 - 32 mmol/L   Glucose, Bld 90 70 - 99 mg/dL   BUN 23 8 - 23 mg/dL   Creatinine, Ser 0.80 0.44 - 1.00 mg/dL   Calcium 8.8 (L) 8.9 - 10.3 mg/dL   GFR calc non Af Amer >60 >60 mL/min   GFR calc Af Amer >60 >60 mL/min   Anion gap 7 5 - 15  CBC     Status: Abnormal   Collection Time: 05/16/20  4:56 AM  Result Value Ref Range   WBC 9.5 4.0 - 10.5 K/uL   RBC 3.74 (L) 3.87 - 5.11 MIL/uL   Hemoglobin 11.7 (L) 12.0 - 15.0 g/dL   HCT 35.4 (L) 36 - 46 %   MCV 94.7 80.0 - 100.0 fL   MCH 31.3 26.0 - 34.0 pg   MCHC 33.1 30.0 - 36.0 g/dL   RDW 13.5 11.5 - 15.5 %   Platelets 193 150 - 400 K/uL   nRBC 0.0 0.0 - 0.2 %     CT ABDOMEN PELVIS W CONTRAST  Result Date: 05/15/2020 CLINICAL DATA:  Right   red blood per rectum EXAM: CT ABDOMEN AND PELVIS WITH CONTRAST TECHNIQUE: Multidetector CT imaging of the abdomen and pelvis was performed using the standard protocol following bolus administration of intravenous contrast. CONTRAST:  80mL OMNIPAQUE IOHEXOL 300 MG/ML  SOLN COMPARISON:  None.  FINDINGS: Lower chest: No acute abnormality. Hepatobiliary: No focal hepatic abnormality. Gallbladder unremarkable. Pancreas: No focal abnormality or ductal dilatation. Spleen: No focal abnormality.  Normal size. Adrenals/Urinary Tract: No adrenal abnormality. No focal renal abnormality. No stones or hydronephrosis. Urinary bladder is unremarkable. Stomach/Bowel: Descending colonic and sigmoid diverticulosis. There is wall thickening within the distal transverse colon and splenic flexure. Mild surrounding stranding. Findings suspicious for colitis. Stomach and small bowel decompressed, unremarkable. Appendix is normal. Vascular/Lymphatic: Aortic atherosclerosis. No evidence of aneurysm or adenopathy. Reproductive: Prior hysterectomy.  No adnexal masses. Other: No free fluid or free air. Musculoskeletal: No acute bony abnormality. IMPRESSION: Wall thickening within the distal transverse colon and splenic flexure concerning for colitis. Left colonic diverticulosis. Aortic atherosclerosis. Electronically Signed   By: Kevin  Dover M.D.   On: 05/15/2020 01:50    ROS:  As stated above in the HPI otherwise negative.  Blood pressure (!) 188/63, pulse 69, temperature 97.6 F (36.4 C), temperature source Oral, resp. rate (!) 22, height 4' 11" (1.499 m), weight 86.6 kg, SpO2 96 %.    PE: Gen: Somnolent, but briefly arousable Lungs: CTA Bilaterally CV: RRR without M/G/R ABD: Soft, NTND, +BS Ext: No C/C/E  Assessment/Plan: 1) Colitis. 2) Hematochezia. 3) Anemia.   The CT scan does show inflammation around the splenic flexure.  Because I was not able to obtain a clinical history, it is not clear if her presentation was secondary to an ischemic colitis.  Further evaluation with a colonoscopy will be performed tomorrow.  Plan: 1) Colonoscopy tomorrow.  Soffia Doshier D 05/16/2020, 10:39 AM     

## 2020-05-16 NOTE — Progress Notes (Signed)
PROGRESS NOTE  Stacy Stanley GUY:[id number] DOB: 1940/10/01 DOA: 05/15/2020 PCP: Lester Garwood., MD  Brief History   Stacy Stanley is a 79 y.o. female with history of hypertension, hypothyroidism, restless leg syndrome presents to the ER admits in [facility] with complaints of having multiple episodes of rectal bleeding with abdominal pain.  Patient states his symptoms started yesterday when she had some constipation for couple of days had a large bowel movement around the morning and it was bloody.  Following which patient started having abdominal crampy pain all over.  No vomiting.  Patient had at least 5 bloody bowel movements and presented to the ER admits in [facility].  No fever chills.  Denies taking any blood thinners or NSAIDs.  Has not had any recent travel or sick contacts.  ED Course: In the ER patient was hemodynamically stable labs are significant for hemoglobin of 12.7 which dropped to 11.9.  1.1 WBC 12.4 CT scan was showing wall thickening of the left transverse colon at the distal aspect and splenic flexure.  Concerning for colitis.  Patient was started on empiric antibiotics.  IV fluids.  Patient admitted for acute GI bleed and colitis.  Covid test was negative.  The patient states that she had one small BM this morning with bright red blood. She continues to have abdominal pain.  Consultants  . Gastroenterology  Procedures  . None  Antibiotics   Anti-infectives (From admission, onward)   Start     Dose/Rate Route Frequency Ordered Stop   05/15/20 2200  cefTRIAXone (ROCEPHIN) 2 g in sodium chloride 0.9 % 100 mL IVPB        2 g 200 mL/hr over 30 Minutes Intravenous Every 24 hours 05/15/20 1924     05/15/20 0300  cefTRIAXone (ROCEPHIN) 1 g in sodium chloride 0.9 % 100 mL IVPB  Status:  Discontinued        1 g 200 mL/hr over 30 Minutes Intravenous Every 24 hours 05/15/20 0249 05/15/20 1924   05/15/20 0300  metroNIDAZOLE (FLAGYL) IVPB 500 mg        500 mg 100 mL/hr over  60 Minutes Intravenous Every 8 hours 05/15/20 0249      .  Subjective  The patient is resting quietly. No new complaints. The patient states that she had one small BM this morning with bright red blood. She continues to have abdominal pain.  Objective   Vitals:  Vitals:   05/16/20 1129 05/16/20 1505  BP: (!) 180/60 (!) 160/49  Pulse:  71  Resp:  20  Temp:  98.2 F (36.8 C)  SpO2:  96%   Exam:  Constitutional:  . The patient is awake, alert, and oriented x 3. No acute distress. Respiratory:  . No increased work of breathing. . No wheezes, rales, or rhonchi . No tactile fremitus Cardiovascular:  . Regular rate and rhythm . No murmurs, ectopy, or gallups. . No lateral PMI. No thrills. Abdomen:  . Abdomen is soft, non-tender, non-distended . No hernias, masses, or organomegaly . Normoactive bowel sounds.  Musculoskeletal:  . No cyanosis, clubbing, or edema Skin:  . No rashes, lesions, ulcers . palpation of skin: no induration or nodules Neurologic:  . CN 2-12 intact . Sensation all 4 extremities intact Psychiatric:  . Mental status o Mood, affect appropriate o Orientation to person, place, time  . judgment and insight appear intact  I have personally reviewed the following:   Today's Data  . Vitals, BMP, CBC  Imaging  .  CT abdomen and pelvis  Scheduled Meds: . hydrALAZINE  50 mg Oral Q8H  . labetalol  300 mg Oral BID  . levothyroxine  112 mcg Oral QAC breakfast  . pantoprazole (PROTONIX) IV  40 mg Intravenous Q12H  . polyethylene glycol-electrolytes  4,000 mL Oral Once  . pregabalin  200 mg Oral q AM   And  . pregabalin  400 mg Oral QHS  . rOPINIRole  0.5 mg Oral BID   Continuous Infusions: . sodium chloride 75 mL/hr at 05/16/20 0916  . sodium chloride    . cefTRIAXone (ROCEPHIN)  IV Stopped (05/15/20 2226)  . metronidazole 500 mg (05/16/20 1409)    Principal Problem:   GI bleed Active Problems:   Colitis   Acute blood loss anemia   Essential  hypertension   Acute GI bleeding   LOS: 1 day   A & P  Acute GI bleed with colitis -we will check lactic acid to rule out any signs of ischemic colitis.  Patient on empiric antibiotics for possible infectious colitis.  No previous history of any inflammatory bowel disease.  CT scan does show diverticulosis.  Patient states she did have a colonoscopy few years ago in [facility].  For now we will keep patient on clear liquids and hydrate and pain relief medication.   Continue antibiotics.  Follow CBC.  GI has been consulted.They plan to proceed with a colonoscopy tomorrow.  Acute blood loss anemia: Hemoglobin is stable at 11.7. Continue to follow CBC.  Acute Kidney Injury: Resolved with IV fluids. Likely due to blood loss. Hydrochlorothiazide held. Continue to monitor electrolytes, creatinine, and volume status.  Hypertension: Blood pressures remain high on labetalol and hydralazine. Will increase dose of hydralazine Holding hydrochlorothiazide due to AKI and dehydration.   Hypothyroidism on Synthroid.  History of restless leg syndrome:  Requip and baclofen. 1. Patient has ecchymotic areas on the face from fall last month.  Since patient has abdominal pain with colitis and GI bleed with acute renal failure will need close monitoring for any further worsening in inpatient status.   DVT prophylaxis: SCDs.  Avoiding anticoagulation due to GI bleed. Code Status: Full code. Family Communication: Discussed with patient. Disposition Plan: Home.   Kalyan Barabas, DO [facility] Direct contact: see [email or web address]  7PM-7AM contact night coverage as above 05/16/2020, 5:17 PM  LOS: 1 day

## 2020-05-16 NOTE — Consult Note (Signed)
Reason for Consult: Colitis Referring Physician: [facility]  Bonita Quin Kovacich HPI: This is a 79 year old female with a PMH of GERD, HTN, peripheral neuropathy, anemia, and anxiety admitted for hematochezia.  The patient is currently very sedated and she is not able to stay awake to provide any history.  The history obtained from the chart states that she experienced abdominal pain and hematochezia.  She denies having a colonoscopy in the past.  There was no record of any fever on admission.  In total she had 5 bloody bowel movements.  There is no history of NSAID use and her HGB is relatively stable in the 11 range.  Past Medical History:  Diagnosis Date  . Anemia   . Anxiety   . Arthritis   . Complication of anesthesia   . Depression   . Difficult intubation    fiberoptic intubation in the setting of C2 fx 08/29/12; now s/p C1-2 and T2-8 fusion (has limited neck ROM)  . GERD (gastroesophageal reflux disease)   . Headache   . High cholesterol    controlled with Atorvastatin  . History of bronchitis   . History of pneumonia   . Hypertension   . Hyponatremia    states that sodium level drops after surgery  . Hypothyroidism   . Numbness and tingling    hands and feet  . Peripheral neuropathy   . Restless leg syndrome   . Urinary incontinence   . Urinary urgency     Past Surgical History:  Procedure Laterality Date  . ABDOMINAL HYSTERECTOMY    . BREAST LUMPECTOMY Left   . CERVICAL FUSION    . COLONOSCOPY    . EYE SURGERY Bilateral    cataracts  . KNEE ARTHROSCOPY Bilateral   . LIGAMENT REPAIR Left 10/05/2017   Procedure: LIGAMENT REPAIR LEFT KNEE RETINACULAR REPAIR;  Surgeon: Marcene Corning, MD;  Location: [facility] OR;  Service: Orthopedics;  Laterality: Left;  Marland Kitchen Medtronic Bladder stimulator insertion    . SPINAL FUSION     posterior  . TONSILLECTOMY    . TOTAL KNEE ARTHROPLASTY Right 08/04/2016   Procedure: TOTAL KNEE ARTHROPLASTY;  Surgeon: Marcene Corning, MD;  Location:  [facility] OR;  Service: Orthopedics;  Laterality: Right;  . TOTAL KNEE ARTHROPLASTY Left 03/02/2017   Procedure: TOTAL KNEE ARTHROPLASTY;  Surgeon: Marcene Corning, MD;  Location: [facility] OR;  Service: Orthopedics;  Laterality: Left;    Family History  Family history unknown: Yes    Social History:  reports that she has never smoked. She has never used smokeless tobacco. She reports that she does not drink alcohol and does not use drugs.  Allergies:  Allergies  Allergen Reactions  . Adhesive [Tape] Itching and Rash  . Ciprofloxacin Rash    Medications:  Scheduled: . hydrALAZINE  50 mg Oral Q8H  . labetalol  300 mg Oral BID  . levothyroxine  112 mcg Oral QAC breakfast  . pantoprazole (PROTONIX) IV  40 mg Intravenous Q12H  . pregabalin  200 mg Oral q AM   And  . pregabalin  400 mg Oral QHS  . rOPINIRole  0.5 mg Oral BID   Continuous: . sodium chloride 75 mL/hr at 05/16/20 0916  . cefTRIAXone (ROCEPHIN)  IV Stopped (05/15/20 2226)  . metronidazole 500 mg (05/16/20 1409)    Results for orders placed or performed during the hospital encounter of 05/15/20 (from the past 24 hour(s))  Hemoglobin and hematocrit, blood     Status: None   Collection Time:  05/15/20 11:00 AM  Result Value Ref Range   Hemoglobin 12.0 12.0 - 15.0 g/dL   HCT 09.9 36 - 46 %  CBC     Status: Abnormal   Collection Time: 05/15/20  8:44 PM  Result Value Ref Range   WBC 11.3 (H) 4.0 - 10.5 K/uL   RBC 3.70 (L) 3.87 - 5.11 MIL/uL   Hemoglobin 11.5 (L) 12.0 - 15.0 g/dL   HCT 83.3 (L) 36 - 46 %   MCV 95.1 80.0 - 100.0 fL   MCH 31.1 26.0 - 34.0 pg   MCHC 32.7 30.0 - 36.0 g/dL   RDW 82.5 05.3 - 97.6 %   Platelets 197 150 - 400 K/uL   nRBC 0.0 0.0 - 0.2 %  Lactic acid, plasma     Status: None   Collection Time: 05/15/20  8:44 PM  Result Value Ref Range   Lactic Acid, Venous 0.6 0.5 - 1.9 mmol/L  Type and screen [facility]     Status: None   Collection Time: 05/15/20  8:44 PM  Result Value Ref  Range   ABO/RH(D) O POS    Antibody Screen NEG    Sample Expiration      05/18/2020,2359 Performed at [facility], [address], [place], [place] [address]   CBC     Status: Abnormal   Collection Time: 05/15/20 10:46 PM  Result Value Ref Range   WBC 11.6 (H) 4.0 - 10.5 K/uL   RBC 3.67 (L) 3.87 - 5.11 MIL/uL   Hemoglobin 11.4 (L) 12.0 - 15.0 g/dL   HCT 37.9 (L) 36 - 46 %   MCV 94.8 80.0 - 100.0 fL   MCH 31.1 26.0 - 34.0 pg   MCHC 32.8 30.0 - 36.0 g/dL   RDW 02.4 09.7 - 35.3 %   Platelets 198 150 - 400 K/uL   nRBC 0.0 0.0 - 0.2 %  Lactic acid, plasma     Status: None   Collection Time: 05/15/20 10:46 PM  Result Value Ref Range   Lactic Acid, Venous 0.6 0.5 - 1.9 mmol/L  Basic metabolic panel     Status: Abnormal   Collection Time: 05/16/20  4:56 AM  Result Value Ref Range   Sodium 136 135 - 145 mmol/L   Potassium 3.9 3.5 - 5.1 mmol/L   Chloride 105 98 - 111 mmol/L   CO2 24 22 - 32 mmol/L   Glucose, Bld 90 70 - 99 mg/dL   BUN 23 8 - 23 mg/dL   Creatinine, Ser 2.99 0.44 - 1.00 mg/dL   Calcium 8.8 (L) 8.9 - 10.3 mg/dL   GFR calc non Af Amer >60 >60 mL/min   GFR calc Af Amer >60 >60 mL/min   Anion gap 7 5 - 15  CBC     Status: Abnormal   Collection Time: 05/16/20  4:56 AM  Result Value Ref Range   WBC 9.5 4.0 - 10.5 K/uL   RBC 3.74 (L) 3.87 - 5.11 MIL/uL   Hemoglobin 11.7 (L) 12.0 - 15.0 g/dL   HCT 24.2 (L) 36 - 46 %   MCV 94.7 80.0 - 100.0 fL   MCH 31.3 26.0 - 34.0 pg   MCHC 33.1 30.0 - 36.0 g/dL   RDW 68.3 41.9 - 62.2 %   Platelets 193 150 - 400 K/uL   nRBC 0.0 0.0 - 0.2 %     CT ABDOMEN PELVIS W CONTRAST  Result Date: 05/15/2020 CLINICAL DATA:  Right  red blood per rectum EXAM: CT ABDOMEN AND PELVIS WITH CONTRAST TECHNIQUE: Multidetector CT imaging of the abdomen and pelvis was performed using the standard protocol following bolus administration of intravenous contrast. CONTRAST:  34mL OMNIPAQUE IOHEXOL 300 MG/ML  SOLN COMPARISON:  None.  FINDINGS: Lower chest: No acute abnormality. Hepatobiliary: No focal hepatic abnormality. Gallbladder unremarkable. Pancreas: No focal abnormality or ductal dilatation. Spleen: No focal abnormality.  Normal size. Adrenals/Urinary Tract: No adrenal abnormality. No focal renal abnormality. No stones or hydronephrosis. Urinary bladder is unremarkable. Stomach/Bowel: Descending colonic and sigmoid diverticulosis. There is wall thickening within the distal transverse colon and splenic flexure. Mild surrounding stranding. Findings suspicious for colitis. Stomach and small bowel decompressed, unremarkable. Appendix is normal. Vascular/Lymphatic: Aortic atherosclerosis. No evidence of aneurysm or adenopathy. Reproductive: Prior hysterectomy.  No adnexal masses. Other: No free fluid or free air. Musculoskeletal: No acute bony abnormality. IMPRESSION: Wall thickening within the distal transverse colon and splenic flexure concerning for colitis. Left colonic diverticulosis. Aortic atherosclerosis. Electronically Signed   By: Charlett Nose M.D.   On: 05/15/2020 01:50    ROS:  As stated above in the HPI otherwise negative.  Blood pressure (!) 188/63, pulse 69, temperature 97.6 F (36.4 C), temperature source Oral, resp. rate (!) 22, height 4\' 11"  (1.499 m), weight 86.6 kg, SpO2 96 %.    PE: Gen: Somnolent, but briefly arousable Lungs: CTA Bilaterally CV: RRR without M/G/R ABD: Soft, NTND, +BS Ext: No C/C/E  Assessment/Plan: 1) Colitis. 2) Hematochezia. 3) Anemia.   The CT scan does show inflammation around the splenic flexure.  Because I was not able to obtain a clinical history, it is not clear if her presentation was secondary to an ischemic colitis.  Further evaluation with a colonoscopy will be performed tomorrow.  Plan: 1) Colonoscopy tomorrow.  Emili Mcloughlin D 05/16/2020, 10:39 AM

## 2020-05-17 ENCOUNTER — Encounter (HOSPITAL_COMMUNITY)
Admission: EM | Disposition: A | Discharge status: Home / Self Care | Payer: Self-pay | Source: Home / Self Care | Attending: Internal Medicine

## 2020-05-17 ENCOUNTER — Inpatient Hospital Stay (HOSPITAL_COMMUNITY): Payer: Medicare Other | Admitting: Anesthesiology

## 2020-05-17 ENCOUNTER — Encounter (HOSPITAL_COMMUNITY): Payer: Self-pay | Admitting: Internal Medicine

## 2020-05-17 DIAGNOSIS — K559 Vascular disorder of intestine, unspecified: Secondary | ICD-10-CM

## 2020-05-17 HISTORY — PX: COLONOSCOPY WITH PROPOFOL: SHX5780

## 2020-05-17 HISTORY — PX: BIOPSY: SHX5522

## 2020-05-17 HISTORY — PX: POLYPECTOMY: SHX5525

## 2020-05-17 SURGERY — COLONOSCOPY WITH PROPOFOL
Anesthesia: Monitor Anesthesia Care

## 2020-05-17 MED ORDER — PROPOFOL 500 MG/50ML IV EMUL
INTRAVENOUS | Status: DC | PRN
  Administered 2020-05-17: 30 mg via INTRAVENOUS

## 2020-05-17 MED ORDER — METRONIDAZOLE 500 MG PO TABS: 500.0000 mg | ORAL_TABLET | Freq: Three times a day (TID) | ORAL | 0 refills | Status: AC

## 2020-05-17 MED ORDER — OXYCODONE HCL 5 MG PO TABS: 5.0000 mg | ORAL_TABLET | Freq: Once | ORAL | Status: DC

## 2020-05-17 MED ORDER — PREGABALIN 75 MG PO CAPS: 400.0000 mg | ORAL_CAPSULE | Freq: Every day | ORAL | Status: DC

## 2020-05-17 MED ORDER — AMOXICILLIN-POT CLAVULANATE 875-125 MG PO TABS: 1.0000 | ORAL_TABLET | Freq: Two times a day (BID) | ORAL | 0 refills | Status: AC

## 2020-05-17 MED ORDER — HYDRALAZINE HCL 20 MG/ML IJ SOLN
10.0000 mg | INTRAMUSCULAR | Status: DC | PRN
  Administered 2020-05-17: 10 mg via INTRAVENOUS

## 2020-05-17 MED ORDER — PANTOPRAZOLE SODIUM 40 MG PO TBEC: 40.0000 mg | DELAYED_RELEASE_TABLET | Freq: Every day | ORAL | 0 refills | Status: AC

## 2020-05-17 MED ORDER — PROPOFOL 500 MG/50ML IV EMUL
INTRAVENOUS | Status: DC | PRN
  Administered 2020-05-17: 135 ug/kg/min via INTRAVENOUS

## 2020-05-17 MED ORDER — ONDANSETRON HCL 4 MG/2ML IJ SOLN
INTRAMUSCULAR | Status: DC | PRN
  Administered 2020-05-17: 4 mg via INTRAVENOUS

## 2020-05-17 MED ORDER — LACTATED RINGERS IV SOLN: INTRAVENOUS | Status: DC | PRN

## 2020-05-17 MED ORDER — HYDRALAZINE HCL 20 MG/ML IJ SOLN
INTRAMUSCULAR | Status: AC
  Filled 2020-05-17: qty 1

## 2020-05-17 MED ORDER — PREGABALIN 75 MG PO CAPS: 200.0000 mg | ORAL_CAPSULE | Freq: Once | ORAL | Status: DC

## 2020-05-17 MED ORDER — LIDOCAINE HCL (CARDIAC) PF 100 MG/5ML IV SOSY
PREFILLED_SYRINGE | INTRAVENOUS | Status: DC | PRN
  Administered 2020-05-17: 50 mg via INTRAVENOUS

## 2020-05-17 SURGICAL SUPPLY — 22 items

## 2020-05-17 NOTE — Interval H&P Note (Signed)
History and Physical Interval Note:  05/17/2020 9:58 AM  Stacy Stanley  has presented today for surgery, with the diagnosis of Colitis.  The various methods of treatment have been discussed with the patient and family. After consideration of risks, benefits and other options for treatment, the patient has consented to  Procedure(s): COLONOSCOPY WITH PROPOFOL (N/A) as a surgical intervention.  The patient's history has been reviewed, patient examined, no change in status, stable for surgery.  I have reviewed the patient's chart and labs.  Questions were answered to the patient's satisfaction.     Roselinda Bahena D

## 2020-05-17 NOTE — Op Note (Signed)
[facility] Patient Name: Stacy Stanley Procedure Date: 05/17/2020 MRN: [id number] Attending MD: Jeani Hawking , MD Date of Birth: [birth year] CSN: [id number] Age: 79 Admit Type: Inpatient Procedure:                Colonoscopy Indications:              Hematochezia Providers:                Jeani Hawking, MD, Dwain Sarna, RN, Leanne Lovely, Technician, Greig Right, CRNA Referring MD:              Medicines:                Propofol per Anesthesia Complications:            No immediate complications. Estimated Blood Loss:     Estimated blood loss was minimal. Procedure:                Pre-Anesthesia Assessment:                           - Prior to the procedure, a History and Physical                            was performed, and patient medications and                            allergies were reviewed. The patient's tolerance of                            previous anesthesia was also reviewed. The risks                            and benefits of the procedure and the sedation                            options and risks were discussed with the patient.                            All questions were answered, and informed consent                            was obtained. Prior Anticoagulants: The patient has                            taken no previous anticoagulant or antiplatelet                            agents. ASA Grade Assessment: II - A patient with                            mild systemic disease. After reviewing the risks  and benefits, the patient was deemed in                            satisfactory condition to undergo the procedure.                           - Sedation was administered by an anesthesia                            professional. Deep sedation was attained.                           After obtaining informed consent, the colonoscope                            was passed under direct vision.  Throughout the                            procedure, the patient's blood pressure, pulse, and                            oxygen saturations were monitored continuously. The                            CF-HQ190L ([id number]) Olympus colonoscope was                            introduced through the anus and advanced to the the                            cecum, identified by appendiceal orifice and                            ileocecal valve. The colonoscopy was performed                            without difficulty. The patient tolerated the                            procedure well. The quality of the bowel                            preparation was excellent. The ileocecal valve,                            appendiceal orifice, and rectum were photographed. Scope In: 10:19:58 AM Scope Out: 10:41:03 AM Scope Withdrawal Time: 0 hours 17 minutes 8 seconds  Total Procedure Duration: 0 hours 21 minutes 5 seconds  Findings:      Three sessile polyps were found in the ascending colon. The polyps were       2 to 3 mm in size. These polyps were removed with a cold snare.       Resection and retrieval were complete.      Scattered small and large-mouthed diverticula were found in the sigmoid  colon and descending colon.      Segmental moderate inflammation characterized by congestion (edema) and       erythema was found in the descending colon and at the splenic flexure.       Biopsies were taken with a cold forceps for histology.      The patient's current findings, and her clinical history, after speaking       with her, is consistent with an ischemic colitis. Impression:               - Three 2 to 3 mm polyps in the ascending colon,                            removed with a cold snare. Resected and retrieved.                           - Diverticulosis in the sigmoid colon and in the                            descending colon.                           - Segmental moderate inflammation was  found in the                            descending colon and at the splenic flexure                            secondary to ischemic colitis. Biopsied. Moderate Sedation:      Not Applicable - Patient had care per Anesthesia. Recommendation:           - Return patient to hospital ward for ongoing care.                           - Resume regular diet.                           - Continue present medications.                           - Await pathology results.                           - Return to GI office in 2 weeks.                           - Okay to D/C home. Procedure Code(s):        --- Professional ---                           [phone], Colonoscopy, flexible; with removal of                            tumor(s), polyp(s), or other lesion(s) by snare  technique                           45380, 59, Colonoscopy, flexible; with biopsy,                            single or multiple Diagnosis Code(s):        --- Professional ---                           K63.5, Polyp of colon                           K55.9, Vascular disorder of intestine, unspecified                           K92.1, Melena (includes Hematochezia)                           K57.30, Diverticulosis of large intestine without                            perforation or abscess without bleeding CPT copyright 2019 American Medical Association. All rights reserved. The codes documented in this report are preliminary and upon coder review may  be revised to meet current compliance requirements. Jeani Hawking, MD Jeani Hawking, MD 05/17/2020 10:50:05 AM This report has been signed electronically. Number of Addenda: 0

## 2020-05-17 NOTE — Transfer of Care (Signed)
Immediate Anesthesia Transfer of Care Note  Patient: Stacy Stanley  Procedure(s) Performed: Procedure(s): COLONOSCOPY WITH PROPOFOL (N/A) POLYPECTOMY BIOPSY  Patient Location: PACU  Anesthesia Type:MAC  Level of Consciousness:  sedated, patient cooperative and responds to stimulation  Airway & Oxygen Therapy:Patient Spontanous Breathing and Patient connected to face mask oxgen  Post-op Assessment:  Report given to PACU RN and Post -op Vital signs reviewed and stable  Post vital signs:  Reviewed and stable  Last Vitals:  Vitals:   05/17/20 0539 05/17/20 0951  BP: (!) 195/66 (!) 205/65  Pulse: 66 72  Resp: 16 18  Temp: 36.8 C 36.9 C  SpO2: 85% 92%    Complications: No apparent anesthesia complications

## 2020-05-17 NOTE — Anesthesia Preprocedure Evaluation (Addendum)
Anesthesia Evaluation  Patient identified by MRN, date of birth, ID band  Reviewed: Allergy & Precautions, NPO status , Patient's Chart, lab work & pertinent test results  History of Anesthesia Complications (+) DIFFICULT AIRWAY  Airway Mallampati: III  TM Distance: >3 FB Neck ROM: Limited  Mouth opening: Limited Mouth Opening  Dental no notable dental hx.    Pulmonary neg pulmonary ROS,    Pulmonary exam normal breath sounds clear to auscultation       Cardiovascular hypertension (poorly controlled; Patient has not received her Labetalol today), Normal cardiovascular exam Rhythm:Regular Rate:Normal     Neuro/Psych  Headaches, PSYCHIATRIC DISORDERS Anxiety Depression  Neuromuscular disease (peripheral neuropathy)    GI/Hepatic GERD  ,  Endo/Other  Hypothyroidism   Renal/GU   negative genitourinary   Musculoskeletal  (+) Arthritis ,   Abdominal   Peds  Hematology  (+) Blood dyscrasia, anemia ,   Anesthesia Other Findings H/o C2 fx in 2013; s/p C1-2 and T2-8 fusion   Reproductive/Obstetrics                           Anesthesia Physical Anesthesia Plan  ASA: III  Anesthesia Plan: MAC   Post-op Pain Management:    Induction:   PONV Risk Score and Plan: 2 and Propofol infusion, TIVA and Treatment may vary due to age or medical condition  Airway Management Planned: Simple Face Mask  Additional Equipment:   Intra-op Plan:   Post-operative Plan:   Informed Consent: I have reviewed the patients History and Physical, chart, labs and discussed the procedure including the risks, benefits and alternatives for the proposed anesthesia with the patient or authorized representative who has indicated his/her understanding and acceptance.       Plan Discussed with:   Anesthesia Plan Comments: (SBP >200. Patient has not received any labetalol today. She is asymptomatic. Plan to treat her BP  perioperatively with goal SBP of <200. She will need to resume her regular anti-hypertensives ASAP after her procedure while she is an inpatient. Norton Blizzard, MD  )      Anesthesia Quick Evaluation

## 2020-05-17 NOTE — Anesthesia Postprocedure Evaluation (Signed)
Anesthesia Post Note  Patient: Stacy Stanley  Procedure(s) Performed: COLONOSCOPY WITH PROPOFOL (N/A ) POLYPECTOMY BIOPSY     Patient location during evaluation: PACU Anesthesia Type: MAC Level of consciousness: awake and alert Pain management: pain level controlled Vital Signs Assessment: post-procedure vital signs reviewed and stable Respiratory status: spontaneous breathing and respiratory function stable Cardiovascular status: stable Postop Assessment: no apparent nausea or vomiting Anesthetic complications: no   No complications documented.  Last Vitals:  Vitals:   05/17/20 1125 05/17/20 1131  BP: (!) 203/53 (!) 190/41  Pulse:  70  Resp:  16  Temp:    SpO2:  94%    Last Pain:  Vitals:   05/17/20 1131  TempSrc:   PainSc: 0-No pain                 Merlinda Frederick

## 2020-05-20 LAB — SURGICAL PATHOLOGY

## 2020-05-21 ENCOUNTER — Encounter (HOSPITAL_COMMUNITY): Payer: Self-pay | Admitting: Gastroenterology

## 2020-05-26 NOTE — Discharge Summary (Signed)
Physician Discharge Summary  Stacy Stanley XTK:[id number] DOB: May 16, 1941 DOA: 05/15/2020  PCP: [place]., MD  Admit date: 05/15/2020 Discharge date: 05/17/2020  Recommendations for Outpatient Follow-up:  1. Discharge to home 2. Follow up with PCP in 7-10 days. 3. Have CBC checked at that visit. 4. Follow up with GI as directed.  Discharge Diagnoses: Principal diagnosis is #1 Acute GI Bleed with colitis Ischemic colitis Acute blood loss anemia Acute kidney injury Hypertension Hypothyroidism Restless LeG Syndrome  Discharge Condition: Fair  Disposition: Home  Diet recommendation: Soft diet  Filed Weights   05/14/20 1943 05/17/20 0951  Weight: 86.6 kg 88.9 kg    History of present illness:   Stacy Stanley is a 79 y.o. female with history of hypertension, hypothyroidism, restless leg syndrome presents to the ER admits in [facility] with complaints of having multiple episodes of rectal bleeding with abdominal pain.  Patient states his symptoms started yesterday when she had some constipation for couple of days had a large bowel movement around the morning and it was bloody.  Following which patient started having abdominal crampy pain all over.  No vomiting.  Patient had at least 5 bloody bowel movements and presented to the ER admits in [facility].  No fever chills.  Denies taking any blood thinners or NSAIDs.  Has not had any recent travel or sick contacts.  ED Course: In the ER patient was hemodynamically stable labs are significant for hemoglobin of 12.7 which dropped to 11.9.  1.1 WBC 12.4 CT scan was showing wall thickening of the left transverse colon at the distal aspect and splenic flexure.  Concerning for colitis.  Patient was started on empiric antibiotics.  IV fluids.  Patient admitted for acute GI bleed and colitis.  Covid test was negative.  Hospital Course:  [facility] were consulted to admit the patient for further evaluation and treatment. Hemoglobin  was carefully monitored. She had one small BM with bright red blood on 05/16/2020. She continued to have abdominal pain. Gastroenterology was consulted. The patient underwent a colonoscopy on the morning of 05/17/2020 with Dr. Jeani Hawking. 3 sessile polyps were found ant removed from the ascending colon. There were scattered small and large mouthed diverticula in the sigmoid colon and descending colon. There was also segmental moderate inflammation in the descending colon at the splenic flexure secondary to ischemic colitis. This area was biopsied.  The patient was cleared for discharge to home. She will follow up with GI as they directed.  Today's assessment: S: The patient is resting comfortably. No new complaints. O: Vitals:  Vitals:   05/17/20 1131 05/17/20 1300  BP: (!) 190/41 (!) 167/50  Pulse: 70 77  Resp: 16 18  Temp:  98.6 F (37 C)  SpO2: 94% 96%   Exam:  Constitutional:  . The patient is awake, alert, and oriented x 3. No acute distress. Respiratory:  . No increased work of breathing. . No wheezes, rales, or rhonchi . No tactile fremitus Cardiovascular:  . Regular rate and rhythm . No murmurs, ectopy, or gallups. . No lateral PMI. No thrills. Abdomen:  . Abdomen is soft, non-tender, non-distended . No hernias, masses, or organomegaly . Normoactive bowel sounds.  Musculoskeletal:  . No cyanosis, clubbing, or edema Skin:  . No rashes, lesions, ulcers . palpation of skin: no induration or nodules Neurologic:  . CN 2-12 intact . Sensation all 4 extremities intact Psychiatric:  . Mental status o Mood, affect appropriate o Orientation to person, place, time  .  judgment and insight appear intact    Discharge Instructions  Discharge Instructions    Activity as tolerated - No restrictions   Complete by: As directed    Call MD for:  severe uncontrolled pain   Complete by: As directed    Call MD for:  temperature >100.4   Complete by: As directed    Diet - low  sodium heart healthy   Complete by: As directed    Discharge instructions   Complete by: As directed    Discharge to home Follow up with PCP in 7-10 days. Have CBC checked at that visit. Follow up with GI as directed.   Increase activity slowly   Complete by: As directed      Allergies as of 05/17/2020      Reactions   Adhesive [tape] Itching, Rash   Ciprofloxacin Rash      Medication List    STOP taking these medications   docusate sodium 100 MG capsule Commonly known as: COLACE   nitrofurantoin 100 MG capsule Commonly known as: MACRODANTIN   phenazopyridine 200 MG tablet Commonly known as: PYRIDIUM     TAKE these medications   atorvastatin 20 MG tablet Commonly known as: LIPITOR Take 20 mg by mouth daily with supper.   baclofen 10 MG tablet Commonly known as: LIORESAL Take 10 mg by mouth 4 (four) times daily as needed for muscle spasms.   busPIRone 10 MG tablet Commonly known as: BUSPAR Take 10 mg by mouth 2 (two) times daily.   hydrALAZINE 50 MG tablet Commonly known as: APRESOLINE Take 50 mg by mouth 3 (three) times daily.   hydrochlorothiazide 12.5 MG tablet Commonly known as: HYDRODIURIL Take 25 mg by mouth as directed. Take on MWF   HYDROcodone-acetaminophen 7.5-325 MG tablet Commonly known as: NORCO Take 1 tablet by mouth every 6 (six) hours as needed for moderate pain or severe pain.   hydrocortisone 2.5 % cream Apply 1 application topically at bedtime.   ketoconazole 2 % cream Commonly known as: NIZORAL Apply 1 application topically daily.   labetalol 300 MG tablet Commonly known as: NORMODYNE Take 300 mg by mouth 3 (three) times daily.   levothyroxine 112 MCG tablet Commonly known as: SYNTHROID Take 112 mcg by mouth daily before breakfast.   losartan 100 MG tablet Commonly known as: COZAAR Take 100 mg by mouth daily.   multivitamin with minerals Tabs tablet Take 1 tablet by mouth daily.   Olopatadine HCl 0.2 % Soln Place 1 drop  into both eyes daily.   pantoprazole 40 MG tablet Commonly known as: Protonix Take 1 tablet (40 mg total) by mouth daily.   pregabalin 300 MG capsule Commonly known as: LYRICA Take 300 mg by mouth 2 (two) times daily.   rizatriptan 5 MG disintegrating tablet Commonly known as: MAXALT-MLT Take 5 mg by mouth daily as needed for migraine. May repeat in 2 hours if needed   rOPINIRole 0.25 MG tablet Commonly known as: REQUIP Take 0.5 mg by mouth in the morning and at bedtime.   topiramate 100 MG tablet Commonly known as: TOPAMAX Take 100 mg by mouth as needed (migraines).   traMADol 300 MG 24 hr tablet Commonly known as: ULTRAM-ER Take 300 mg by mouth daily.   traZODone 50 MG tablet Commonly known as: DESYREL Take 50 mg by mouth at bedtime.   venlafaxine XR 75 MG 24 hr capsule Commonly known as: EFFEXOR-XR Take 75 mg by mouth every evening.   Vitamin D3 50 MCG (  2000 UT) capsule Take 2,000 Units by mouth daily.     ASK your doctor about these medications   amoxicillin-clavulanate 875-125 MG tablet Commonly known as: Augmentin Take 1 tablet by mouth 2 (two) times daily for 5 days. Ask about: Should I take this medication?   metroNIDAZOLE 500 MG tablet Commonly known as: Flagyl Take 1 tablet (500 mg total) by mouth 3 (three) times daily for 5 days. Ask about: Should I take this medication?      Allergies  Allergen Reactions  . Adhesive [Tape] Itching and Rash  . Ciprofloxacin Rash    The results of significant diagnostics from this hospitalization (including imaging, microbiology, ancillary and laboratory) are listed below for reference.    Significant Diagnostic Studies: CT ABDOMEN PELVIS W CONTRAST  Result Date: 05/15/2020 CLINICAL DATA:  Right red blood per rectum EXAM: CT ABDOMEN AND PELVIS WITH CONTRAST TECHNIQUE: Multidetector CT imaging of the abdomen and pelvis was performed using the standard protocol following bolus administration of intravenous  contrast. CONTRAST:  79mL OMNIPAQUE IOHEXOL 300 MG/ML  SOLN COMPARISON:  None. FINDINGS: Lower chest: No acute abnormality. Hepatobiliary: No focal hepatic abnormality. Gallbladder unremarkable. Pancreas: No focal abnormality or ductal dilatation. Spleen: No focal abnormality.  Normal size. Adrenals/Urinary Tract: No adrenal abnormality. No focal renal abnormality. No stones or hydronephrosis. Urinary bladder is unremarkable. Stomach/Bowel: Descending colonic and sigmoid diverticulosis. There is wall thickening within the distal transverse colon and splenic flexure. Mild surrounding stranding. Findings suspicious for colitis. Stomach and small bowel decompressed, unremarkable. Appendix is normal. Vascular/Lymphatic: Aortic atherosclerosis. No evidence of aneurysm or adenopathy. Reproductive: Prior hysterectomy.  No adnexal masses. Other: No free fluid or free air. Musculoskeletal: No acute bony abnormality. IMPRESSION: Wall thickening within the distal transverse colon and splenic flexure concerning for colitis. Left colonic diverticulosis. Aortic atherosclerosis. Electronically Signed   By: Charlett Nose M.D.   On: 05/15/2020 01:50    Microbiology: No results found for this or any previous visit (from the past 240 hour(s)).   Labs: Basic Metabolic Panel: No results for input(s): NA, K, CL, CO2, GLUCOSE, BUN, CREATININE, CALCIUM, MG, PHOS in the last 168 hours. Liver Function Tests: No results for input(s): AST, ALT, ALKPHOS, BILITOT, PROT, ALBUMIN in the last 168 hours. No results for input(s): LIPASE, AMYLASE in the last 168 hours. No results for input(s): AMMONIA in the last 168 hours. CBC: No results for input(s): WBC, NEUTROABS, HGB, HCT, MCV, PLT in the last 168 hours. Cardiac Enzymes: No results for input(s): CKTOTAL, CKMB, CKMBINDEX, TROPONINI in the last 168 hours. BNP: BNP (last 3 results) No results for input(s): BNP in the last 8760 hours.  ProBNP (last 3 results) No results for  input(s): PROBNP in the last 8760 hours.  CBG: No results for input(s): GLUCAP in the last 168 hours.  Principal Problem:   GI bleed Active Problems:   Colitis   Acute blood loss anemia   Essential hypertension   Acute GI bleeding   Time coordinating discharge: 38 minutes.  Signed:        Araseli Sherry, DO [facility]  05/26/2020, 2:49 PM

## 2021-08-17 ENCOUNTER — Emergency Department (HOSPITAL_BASED_OUTPATIENT_CLINIC_OR_DEPARTMENT_OTHER)
Admission: EM | Admit: 2021-08-17 | Discharge: 2021-08-17 | Disposition: A | Discharge status: Home / Self Care | Payer: Medicare Other | Attending: Emergency Medicine | Admitting: Emergency Medicine

## 2021-08-17 ENCOUNTER — Emergency Department (HOSPITAL_BASED_OUTPATIENT_CLINIC_OR_DEPARTMENT_OTHER): Payer: Medicare Other

## 2021-08-17 ENCOUNTER — Other Ambulatory Visit: Payer: Self-pay

## 2021-08-17 ENCOUNTER — Encounter (HOSPITAL_BASED_OUTPATIENT_CLINIC_OR_DEPARTMENT_OTHER): Payer: Self-pay

## 2021-08-17 DIAGNOSIS — Z96653 Presence of artificial knee joint, bilateral: Secondary | ICD-10-CM | POA: Insufficient documentation

## 2021-08-17 DIAGNOSIS — E039 Hypothyroidism, unspecified: Secondary | ICD-10-CM | POA: Diagnosis not present

## 2021-08-17 DIAGNOSIS — W01198A Fall on same level from slipping, tripping and stumbling with subsequent striking against other object, initial encounter: Secondary | ICD-10-CM | POA: Insufficient documentation

## 2021-08-17 DIAGNOSIS — R109 Unspecified abdominal pain: Secondary | ICD-10-CM | POA: Diagnosis not present

## 2021-08-17 DIAGNOSIS — S0990XA Unspecified injury of head, initial encounter: Secondary | ICD-10-CM | POA: Insufficient documentation

## 2021-08-17 DIAGNOSIS — R0789 Other chest pain: Secondary | ICD-10-CM | POA: Insufficient documentation

## 2021-08-17 DIAGNOSIS — M545 Low back pain, unspecified: Secondary | ICD-10-CM | POA: Diagnosis not present

## 2021-08-17 DIAGNOSIS — I10 Essential (primary) hypertension: Secondary | ICD-10-CM | POA: Insufficient documentation

## 2021-08-17 DIAGNOSIS — W19XXXA Unspecified fall, initial encounter: Secondary | ICD-10-CM

## 2021-08-17 MED ORDER — OXYCODONE-ACETAMINOPHEN 5-325 MG PO TABS
1.0000 | ORAL_TABLET | Freq: Once | ORAL | Status: AC
  Administered 2021-08-17: 1 via ORAL
  Filled 2021-08-17: qty 1

## 2021-08-17 NOTE — Discharge Instructions (Signed)

## 2021-08-17 NOTE — ED Notes (Signed)
Patient transported to CT 

## 2021-08-17 NOTE — ED Provider Notes (Signed)
Emergency Department Provider Note   I have reviewed the triage vital signs and the nursing notes.   HISTORY  Chief Complaint Fall (Ground level)   HPI Stacy Stanley is a 80 y.o. female with PMH reviewed presents to the ED by EMS after a fall while shopping at [facility]. She denies any syncope. She notes that she turned to rotate backwards and lost her balance falling to the ground. Denies any palpitations, SOB, or CP. She did hit her head on the floor and is not anticoagulated. Denies neck pain. No pain in the arms/legs but some pain along the left chest wall and flank.    Past Medical History:  Diagnosis Date   Anemia    Anxiety    Arthritis    Complication of anesthesia    Depression    Difficult intubation    fiberoptic intubation in the setting of C2 fx 08/29/12; now s/p C1-2 and T2-8 fusion (has limited neck ROM)   GERD (gastroesophageal reflux disease)    Headache    High cholesterol    controlled with Atorvastatin   History of bronchitis    History of pneumonia    Hypertension    Hyponatremia    states that sodium level drops after surgery   Hypothyroidism    Numbness and tingling    hands and feet   Peripheral neuropathy    Restless leg syndrome    Urinary incontinence    Urinary urgency     Patient Active Problem List   Diagnosis Date Noted   GI bleed 05/15/2020   Colitis 05/15/2020   Acute blood loss anemia 05/15/2020   Essential hypertension 05/15/2020   Acute GI bleeding 05/15/2020   Left knee pain 10/05/2017   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   Primary osteoarthritis of right knee 08/04/2016    Past Surgical History:  Procedure Laterality Date   ABDOMINAL HYSTERECTOMY     BIOPSY  05/17/2020   Procedure: BIOPSY;  Surgeon: Jeani Hawking, MD;  Location: [facility];  Service: Endoscopy;;   BREAST LUMPECTOMY Left    CERVICAL FUSION     COLONOSCOPY     COLONOSCOPY WITH PROPOFOL N/A 05/17/2020   Procedure: COLONOSCOPY WITH PROPOFOL;   Surgeon: Jeani Hawking, MD;  Location: [facility];  Service: Endoscopy;  Laterality: N/A;   EYE SURGERY Bilateral    cataracts   KNEE ARTHROSCOPY Bilateral    LIGAMENT REPAIR Left 10/05/2017   Procedure: LIGAMENT REPAIR LEFT KNEE RETINACULAR REPAIR;  Surgeon: Marcene Corning, MD;  Location: [facility] OR;  Service: Orthopedics;  Laterality: Left;   [employer] Bladder stimulator insertion     POLYPECTOMY  05/17/2020   Procedure: POLYPECTOMY;  Surgeon: Jeani Hawking, MD;  Location: [facility];  Service: Endoscopy;;   SPINAL FUSION     posterior   TONSILLECTOMY     TOTAL KNEE ARTHROPLASTY Right 08/04/2016   Procedure: TOTAL KNEE ARTHROPLASTY;  Surgeon: Marcene Corning, MD;  Location: [facility] OR;  Service: Orthopedics;  Laterality: Right;   TOTAL KNEE ARTHROPLASTY Left 03/02/2017   Procedure: TOTAL KNEE ARTHROPLASTY;  Surgeon: Marcene Corning, MD;  Location: [facility] OR;  Service: Orthopedics;  Laterality: Left;    Allergies Adhesive [tape] and Ciprofloxacin  Family History  Family history unknown: Yes    Social History Social History   Tobacco Use   Smoking status: Never    Passive exposure: Never   Smokeless tobacco: Never  Vaping Use   Vaping Use: Never used  Substance Use Topics   Alcohol use:  No   Drug use: No    Review of Systems  Constitutional: No fever/chills Eyes: No visual changes. ENT: No sore throat. Cardiovascular: Left lateral/posterior chest pain. Respiratory: Denies shortness of breath. Gastrointestinal: No abdominal pain.  Positive left flank pain. No nausea, no vomiting.  No diarrhea.  No constipation. Genitourinary: Negative for dysuria. Musculoskeletal: Positive for back pain. Skin: Abrasion to the left elbow.  Neurological: Negative for focal weakness or numbness. Positive HA.   10-point ROS otherwise negative.   ____________________________________________   PHYSICAL EXAM:  VITAL SIGNS: ED Triage Vitals  Enc Vitals Group     BP 08/17/21 1935 (!) 159/81      Pulse Rate 08/17/21 1935 66     Resp 08/17/21 1935 17     Temp 08/17/21 1935 98 F (36.7 C)     Temp Source 08/17/21 1935 Oral     SpO2 08/17/21 1935 95 %     Weight 08/17/21 1944 201 lb 12.8 oz (91.5 kg)     Height 08/17/21 1944 4\' 11"  (1.499 m)    Constitutional: Alert and oriented. Well appearing and in no acute distress. Eyes: Conjunctivae are normal. PERRL. EOMI. Head: Scalp hematoma posteriorly without laceration.  Nose: No congestion/rhinnorhea. Mouth/Throat: Mucous membranes are moist.   Neck: No stridor. No cervical spine tenderness to palpation. Cardiovascular: Normal rate, regular rhythm. Good peripheral circulation. Grossly normal heart sounds.   Respiratory: Normal respiratory effort.  No retractions. Lungs CTAB. Gastrointestinal: Soft and nontender. No distention.  Musculoskeletal: No lower extremity tenderness nor edema. No gross deformities of extremities. Normal ROM of the bilateral upper and lower extremities (active and passive). Normal ROM of the left elbow specifically with no bony tenderness. No scaphoid tenderness.  Neurologic:  Normal speech and language. No gross focal neurologic deficits are appreciated.  Skin:  Skin is warm and dry. Abrasion to the left elbow. No laceration.    ____________________________________________  RADIOLOGY  CT head, c-spine and chest, abdomen, and pelvis reviewed.   ____________________________________________   PROCEDURES  Procedure(s) performed:   Procedures  None ____________________________________________   INITIAL IMPRESSION / ASSESSMENT AND PLAN / ED COURSE  Pertinent labs & imaging results that were available during my care of the patient were reviewed by me and considered in my medical decision making (see chart for details).   Patient presents to the ED after a fall while shopping. She is not anticoagulated. Normal ROM of all extremities. Normal neuro exam. Doubt CVA or ACS. CT imaging reviewed with no  acute findings. Patient given pain medication here and feeling improved but stiff. Able to ambulate. Plan for discharge home with plan for early mobility and PCP follow up.    ____________________________________________  FINAL CLINICAL IMPRESSION(S) / ED DIAGNOSES  Final diagnoses:  Fall, initial encounter  Injury of head, initial encounter  Acute bilateral low back pain without sciatica     MEDICATIONS GIVEN DURING THIS VISIT:  Medications  oxyCODONE-acetaminophen (PERCOCET/ROXICET) 5-325 MG per tablet 1 tablet (1 tablet Oral Given 08/17/21 1959)      Note:  This document was prepared using Dragon voice recognition software and may include unintentional dictation errors.  Nanda Quinton, MD, [facility] Emergency Medicine    Haniah Penny, [facility], MD 08/21/21 [phone]

## 2021-08-17 NOTE — ED Notes (Signed)
Patient discharged to home.  All discharge instructions reviewed.  Patient verbalized understanding via teachback method.  VS WDL.  Respirations even and unlabored.  Ambulatory out of ED.   °

## 2021-08-17 NOTE — ED Triage Notes (Signed)
Patient presents via EMS following a ground level fall at the [employer].  She states she feels she lost her balance and fell backwards.  Denies tripping over anything.  She hit her head on the tile floor, and also complains of pain to L elbow and middle to lower back.  Denies loss of consciousness.  Denies taking blood thinner medication.  Patient presents with hematoma to posterior scalp.  No C collar in place on arrival.

## 2021-09-15 ENCOUNTER — Other Ambulatory Visit: Payer: Self-pay | Admitting: Gastroenterology

## 2021-10-23 ENCOUNTER — Encounter (HOSPITAL_COMMUNITY): Payer: Self-pay | Admitting: Gastroenterology

## 2021-10-23 NOTE — Progress Notes (Signed)
Attempted to obtain medical history via telephone, unable to reach at this time. Unable to leave voicemail to return pre surgical testing department's phone call.   ?

## 2021-10-31 ENCOUNTER — Encounter (HOSPITAL_COMMUNITY): Admission: RE | Payer: Self-pay | Source: Home / Self Care

## 2021-10-31 ENCOUNTER — Ambulatory Visit (HOSPITAL_COMMUNITY): Admission: RE | Admit: 2021-10-31 | Payer: Medicare Other | Source: Home / Self Care | Admitting: Gastroenterology

## 2021-10-31 SURGERY — SIGMOIDOSCOPY, FLEXIBLE
Anesthesia: Monitor Anesthesia Care

## 2022-12-06 ENCOUNTER — Emergency Department (HOSPITAL_COMMUNITY): Payer: Medicare Other

## 2022-12-06 ENCOUNTER — Emergency Department (HOSPITAL_COMMUNITY)
Admission: EM | Admit: 2022-12-06 | Discharge: 2022-12-06 | Disposition: A | Discharge status: Home / Self Care | Payer: Medicare Other | Attending: Emergency Medicine | Admitting: Emergency Medicine

## 2022-12-06 ENCOUNTER — Encounter (HOSPITAL_COMMUNITY): Payer: Self-pay

## 2022-12-06 ENCOUNTER — Other Ambulatory Visit: Payer: Self-pay

## 2022-12-06 DIAGNOSIS — E039 Hypothyroidism, unspecified: Secondary | ICD-10-CM | POA: Diagnosis not present

## 2022-12-06 DIAGNOSIS — K5732 Diverticulitis of large intestine without perforation or abscess without bleeding: Secondary | ICD-10-CM | POA: Diagnosis not present

## 2022-12-06 DIAGNOSIS — R109 Unspecified abdominal pain: Secondary | ICD-10-CM | POA: Diagnosis present

## 2022-12-06 DIAGNOSIS — Z96653 Presence of artificial knee joint, bilateral: Secondary | ICD-10-CM | POA: Diagnosis not present

## 2022-12-06 DIAGNOSIS — Z79899 Other long term (current) drug therapy: Secondary | ICD-10-CM | POA: Diagnosis not present

## 2022-12-06 DIAGNOSIS — N3090 Cystitis, unspecified without hematuria: Secondary | ICD-10-CM | POA: Insufficient documentation

## 2022-12-06 DIAGNOSIS — I10 Essential (primary) hypertension: Secondary | ICD-10-CM | POA: Insufficient documentation

## 2022-12-06 DIAGNOSIS — K5792 Diverticulitis of intestine, part unspecified, without perforation or abscess without bleeding: Secondary | ICD-10-CM

## 2022-12-06 LAB — CBC WITH DIFFERENTIAL/PLATELET
Abs Immature Granulocytes: 0.07 10*3/uL (ref 0.00–0.07)
Basophils Absolute: 0 10*3/uL (ref 0.0–0.1)
Basophils Relative: 0 %
Eosinophils Absolute: 0.1 10*3/uL (ref 0.0–0.5)
Eosinophils Relative: 1 %
HCT: 34.3 % — ABNORMAL LOW (ref 36.0–46.0)
Hemoglobin: 10.7 g/dL — ABNORMAL LOW (ref 12.0–15.0)
Immature Granulocytes: 0 %
Lymphocytes Relative: 18 %
Lymphs Abs: 2.9 10*3/uL (ref 0.7–4.0)
MCH: 29.1 pg (ref 26.0–34.0)
MCHC: 31.2 g/dL (ref 30.0–36.0)
MCV: 93.2 fL (ref 80.0–100.0)
Monocytes Absolute: 1.6 10*3/uL — ABNORMAL HIGH (ref 0.1–1.0)
Monocytes Relative: 10 %
Neutro Abs: 11.1 10*3/uL — ABNORMAL HIGH (ref 1.7–7.7)
Neutrophils Relative %: 71 %
Platelets: 203 10*3/uL (ref 150–400)
RBC: 3.68 MIL/uL — ABNORMAL LOW (ref 3.87–5.11)
RDW: 15.1 % (ref 11.5–15.5)
WBC: 15.9 10*3/uL — ABNORMAL HIGH (ref 4.0–10.5)
nRBC: 0 % (ref 0.0–0.2)

## 2022-12-06 LAB — COMPREHENSIVE METABOLIC PANEL
ALT: 31 U/L (ref 0–44)
AST: 49 U/L — ABNORMAL HIGH (ref 15–41)
Albumin: 3.1 g/dL — ABNORMAL LOW (ref 3.5–5.0)
Alkaline Phosphatase: 58 U/L (ref 38–126)
Anion gap: 9 (ref 5–15)
BUN: 52 mg/dL — ABNORMAL HIGH (ref 8–23)
CO2: 23 mmol/L (ref 22–32)
Calcium: 9.1 mg/dL (ref 8.9–10.3)
Chloride: 101 mmol/L (ref 98–111)
Creatinine, Ser: 1.03 mg/dL — ABNORMAL HIGH (ref 0.44–1.00)
GFR, Estimated: 55 mL/min — ABNORMAL LOW (ref >60)
Glucose, Bld: 118 mg/dL — ABNORMAL HIGH (ref 70–99)
Potassium: 4.3 mmol/L (ref 3.5–5.1)
Sodium: 133 mmol/L — ABNORMAL LOW (ref 135–145)
Total Bilirubin: 0.5 mg/dL (ref 0.3–1.2)
Total Protein: 6.2 g/dL — ABNORMAL LOW (ref 6.5–8.1)

## 2022-12-06 LAB — LIPASE, BLOOD: Lipase: 27 U/L (ref 11–51)

## 2022-12-06 LAB — TYPE AND SCREEN
ABO/RH(D): O POS
Antibody Screen: NEGATIVE

## 2022-12-06 LAB — URINALYSIS, ROUTINE W REFLEX MICROSCOPIC
Bilirubin Urine: NEGATIVE
Glucose, UA: NEGATIVE mg/dL
Ketones, ur: NEGATIVE mg/dL
Nitrite: NEGATIVE
Protein, ur: NEGATIVE mg/dL
Specific Gravity, Urine: 1.024 (ref 1.005–1.030)
WBC, UA: >50 WBC/hpf (ref 0–5)
pH: 5 (ref 5.0–8.0)

## 2022-12-06 LAB — PROTIME-INR
INR: 1.1 (ref 0.8–1.2)
Prothrombin Time: 14 seconds (ref 11.4–15.2)

## 2022-12-06 MED ORDER — AMOXICILLIN-POT CLAVULANATE 875-125 MG PO TABS: 1.0000 | ORAL_TABLET | Freq: Two times a day (BID) | ORAL | 0 refills | Status: AC

## 2022-12-06 MED ORDER — SODIUM CHLORIDE 0.9 % IV SOLN
1.0000 g | Freq: Once | INTRAVENOUS | Status: AC
  Administered 2022-12-06: 1 g via INTRAVENOUS
  Filled 2022-12-06: qty 10

## 2022-12-06 MED ORDER — PANTOPRAZOLE SODIUM 40 MG IV SOLR
40.0000 mg | Freq: Once | INTRAVENOUS | Status: AC
  Administered 2022-12-06: 40 mg via INTRAVENOUS
  Filled 2022-12-06: qty 10

## 2022-12-06 MED ORDER — IOHEXOL 350 MG/ML SOLN
75.0000 mL | Freq: Once | INTRAVENOUS | Status: AC | PRN
  Administered 2022-12-06: 75 mL via INTRAVENOUS

## 2022-12-06 NOTE — ED Provider Notes (Signed)
[facility] Provider Note  CSN: [id number] Arrival date & time: 12/06/22 1541  Chief Complaint(s) Rectal Bleeding  HPI Stacy Stanley is a 82 y.o. female with past medical history as below, significant for hypertension, hypothyroid who presents to the ED with complaint of rectal bleeding, left-sided abdominal pain.  Symptoms ongoing x 1 month, she was admitted 3/21 and had [facility].  Patient found to have norovirus induced diarrhea and diverticulitis, colonoscopy with sigmoid diverticulosis, external hemorrhoids, sigmoid ulcers, likely ischemic colitis.  Patient found to have rectocele on imaging during recent evaluation, it was relayed to patient that recurrent rectal bleeding could be an ongoing issue, no need for surgical intervention at that time.  Family is here today because rectal bleeding has continued and they are seeking further care in regards to recurrent rectal bleeding.  Seeking second opinion from another GI team.  Feels the bleeding been ongoing for the past 3 days, dark blood primarily with bowel movements also some bleeding outside of bowel movements.  No vaginal bleeding or discharge, no hematuria, no hematemesis, no rashes, no excessive alcohol or NSAID use reported.  Past Medical History Past Medical History:  Diagnosis Date   Anemia    Anxiety    Arthritis    Complication of anesthesia    Depression    Difficult intubation    fiberoptic intubation in the setting of C2 fx 08/29/12; now s/p C1-2 and T2-8 fusion (has limited neck ROM)   GERD (gastroesophageal reflux disease)    Headache    High cholesterol    controlled with Atorvastatin   History of bronchitis    History of pneumonia    Hypertension    Hyponatremia    states that sodium level drops after surgery   Hypothyroidism    Numbness and tingling    hands and feet   Peripheral neuropathy    Restless leg syndrome    Urinary incontinence    Urinary urgency     Patient Active Problem List   Diagnosis Date Noted   GI bleed 05/15/2020   Colitis 05/15/2020   Acute blood loss anemia 05/15/2020   Essential hypertension 05/15/2020   Acute GI bleeding 05/15/2020   Left knee pain 10/05/2017   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 Authorizing Provider  amoxicillin-clavulanate (AUGMENTIN) 875-125 MG tablet Take 1 tablet by mouth every 12 (twelve) hours for 10 days. 12/07/22 12/17/22 Yes Wynona Dove A, DO  atorvastatin (LIPITOR) 20 MG tablet Take 20 mg by mouth daily with supper.    [provider]  baclofen (LIORESAL) 10 MG tablet Take 10 mg by mouth 4 (four) times daily as needed for muscle spasms.    [provider]  busPIRone (BUSPAR) 10 MG tablet Take 10 mg by mouth 2 (two) times daily.    [provider]  Cholecalciferol (VITAMIN D3) 2000 units capsule Take 2,000 Units by mouth daily.    [provider]  hydrALAZINE (APRESOLINE) 50 MG tablet Take 50 mg by mouth 3 (three) times daily.    [provider]  hydrochlorothiazide (HYDRODIURIL) 12.5 MG tablet Take 25 mg by mouth as directed. Take on MWF 04/22/20   [provider]  HYDROcodone-acetaminophen (NORCO) 7.5-325 MG tablet Take 1 tablet by mouth every 6 (six) hours as needed for moderate pain or severe pain.  [provider]  hydrocortisone 2.5 % cream Apply 1 application topically at bedtime.  04/11/20   [provider]  ketoconazole (NIZORAL) 2 % cream Apply 1 application topically daily.  04/11/20   [provider]  labetalol (NORMODYNE) 300 MG tablet Take 300 mg by mouth 3 (three) times daily.    [provider]  levothyroxine (SYNTHROID, LEVOTHROID) 112 MCG tablet Take 112 mcg by mouth daily before breakfast.    [provider]  losartan (COZAAR) 100  MG tablet Take 100 mg by mouth daily.    [provider]  Multiple Vitamin (MULTIVITAMIN WITH MINERALS) TABS tablet Take 1 tablet by mouth daily.    [provider]  Olopatadine HCl 0.2 % SOLN Place 1 drop into both eyes daily.  03/05/20   [provider]  pantoprazole (PROTONIX) 40 MG tablet Take 1 tablet (40 mg total) by mouth daily. 05/17/20 06/16/20  Swayze, Ava, DO  pregabalin (LYRICA) 300 MG capsule Take 300 mg by mouth 2 (two) times daily.    [provider]  rizatriptan (MAXALT-MLT) 5 MG disintegrating tablet Take 5 mg by mouth daily as needed for migraine. May repeat in 2 hours if needed     [provider]  rOPINIRole (REQUIP) 0.25 MG tablet Take 0.5 mg by mouth in the morning and at bedtime.     [provider]  topiramate (TOPAMAX) 100 MG tablet Take 100 mg by mouth as needed (migraines).     [provider]  traMADol (ULTRAM-ER) 300 MG 24 hr tablet Take 300 mg by mouth daily.    [provider]  traZODone (DESYREL) 50 MG tablet Take 50 mg by mouth at bedtime.     [provider]  venlafaxine XR (EFFEXOR-XR) 75 MG 24 hr capsule Take 75 mg by mouth every evening.     [provider]                                                                                                                                    Past Surgical History Past Surgical History:  Procedure Laterality Date   ABDOMINAL HYSTERECTOMY     BIOPSY  05/17/2020   Procedure: BIOPSY;  Surgeon: Carol Ada, MD;  Location: [facility];  Service: Endoscopy;;   BREAST LUMPECTOMY Left    CERVICAL FUSION     COLONOSCOPY     COLONOSCOPY WITH PROPOFOL N/A 05/17/2020   Procedure: COLONOSCOPY WITH PROPOFOL;  Surgeon: Carol Ada, MD;  Location: [facility];  Service: Endoscopy;  Laterality: N/A;   EYE SURGERY Bilateral    cataracts   KNEE ARTHROSCOPY Bilateral    LIGAMENT REPAIR Left 10/05/2017   Procedure: LIGAMENT REPAIR LEFT KNEE  RETINACULAR REPAIR;  Surgeon: Melrose Nakayama, MD;  Location: [place];  Service: Orthopedics;  Laterality: Left;   [employer] Bladder stimulator insertion     POLYPECTOMY  05/17/2020   Procedure: POLYPECTOMY;  Surgeon: Benson Norway,  Saralyn Pilar, MD;  Location: [facility];  Service: Endoscopy;;   SPINAL FUSION     posterior   TONSILLECTOMY     TOTAL KNEE ARTHROPLASTY Right 08/04/2016   Procedure: TOTAL KNEE ARTHROPLASTY;  Surgeon: Melrose Nakayama, MD;  Location: [place];  Service: Orthopedics;  Laterality: Right;   TOTAL KNEE ARTHROPLASTY Left 03/02/2017   Procedure: TOTAL KNEE ARTHROPLASTY;  Surgeon: Melrose Nakayama, MD;  Location: [facility];  Service: Orthopedics;  Laterality: Left;   Family History Family History  Family history unknown: Yes    Social History Social History   Tobacco Use   Smoking status: Never    Passive exposure: Never   Smokeless tobacco: Never  Vaping Use   Vaping Use: Never used  Substance Use Topics   Alcohol use: No   Drug use: No   Allergies Adhesive [tape] and Ciprofloxacin  Review of Systems Review of Systems  Constitutional:  Negative for activity change and fever.  HENT:  Negative for facial swelling and trouble swallowing.   Eyes:  Negative for discharge and redness.  Respiratory:  Negative for cough and shortness of breath.   Cardiovascular:  Negative for chest pain and palpitations.  Gastrointestinal:  Positive for abdominal pain, anal bleeding and blood in stool. Negative for nausea.  Genitourinary:  Negative for dysuria and flank pain.  Musculoskeletal:  Negative for back pain and gait problem.  Skin:  Negative for pallor and rash.  Neurological:  Negative for syncope and headaches.    Physical Exam Vital Signs  I have reviewed the triage vital signs BP (!) 91/59   Pulse 70   Temp 98.2 F (36.8 C) (Oral)   Resp 17   Ht 4\' 9"  (1.448 m)   Wt 88 kg   SpO2 96%   BMI 41.98 kg/m  Physical Exam Vitals and nursing note reviewed.  Constitutional:       General: She is not in acute distress.    Appearance: Normal appearance.  HENT:     Head: Normocephalic and atraumatic.     Right Ear: External ear normal.     Left Ear: External ear normal.     Nose: Nose normal.     Mouth/Throat:     Mouth: Mucous membranes are moist.  Eyes:     General: No scleral icterus.       Right eye: No discharge.        Left eye: No discharge.  Cardiovascular:     Rate and Rhythm: Normal rate and regular rhythm.     Pulses: Normal pulses.     Heart sounds: Normal heart sounds.  Pulmonary:     Effort: Pulmonary effort is normal. No respiratory distress.     Breath sounds: Normal breath sounds.  Abdominal:     General: Abdomen is flat.     Palpations: Abdomen is soft.     Tenderness: There is abdominal tenderness.  Musculoskeletal:        General: Normal range of motion.     Cervical back: Normal range of motion.     Right lower leg: No edema.     Left lower leg: No edema.  Skin:    General: Skin is warm and dry.     Capillary Refill: Capillary refill takes less than 2 seconds.  Neurological:     Mental Status: She is alert and oriented to person, place, and time.     GCS: GCS eye subscore is 4. GCS verbal subscore is 5. GCS motor subscore is  6.  Psychiatric:        Mood and Affect: Mood normal.        Behavior: Behavior normal.     ED Results and Treatments Labs (all labs ordered are listed, but only abnormal results are displayed) Labs Reviewed  CBC WITH DIFFERENTIAL/PLATELET - Abnormal; Notable for the following components:      Result Value   WBC 15.9 (*)    RBC 3.68 (*)    Hemoglobin 10.7 (*)    HCT 34.3 (*)    Neutro Abs 11.1 (*)    Monocytes Absolute 1.6 (*)    All other components within normal limits  COMPREHENSIVE METABOLIC PANEL - Abnormal; Notable for the following components:   Sodium 133 (*)    Glucose, Bld 118 (*)    BUN 52 (*)    Creatinine, Ser 1.03 (*)    Total Protein 6.2 (*)    Albumin 3.1 (*)    AST 49 (*)     GFR, Estimated 55 (*)    All other components within normal limits  URINALYSIS, ROUTINE W REFLEX MICROSCOPIC - Abnormal; Notable for the following components:   APPearance HAZY (*)    Hgb urine dipstick SMALL (*)    Leukocytes,Ua LARGE (*)    Bacteria, UA FEW (*)    Non Squamous Epithelial 0-5 (*)    All other components within normal limits  URINE CULTURE  LIPASE, BLOOD  PROTIME-INR  OCCULT BLOOD X 1 CARD TO LAB, STOOL  TYPE AND SCREEN                                                                                                                          Radiology CT ABDOMEN PELVIS W CONTRAST  Result Date: 12/06/2022 CLINICAL DATA:  Left lower quadrant abdominal pain with rectal bleeding. History of diverticulitis. EXAM: CT ABDOMEN AND PELVIS WITH CONTRAST TECHNIQUE: Multidetector CT imaging of the abdomen and pelvis was performed using the standard protocol following bolus administration of intravenous contrast. RADIATION DOSE REDUCTION: This exam was performed according to the departmental dose-optimization program which includes automated exposure control, adjustment of the mA and/or kV according to patient size and/or use of iterative reconstruction technique. CONTRAST:  21mL OMNIPAQUE IOHEXOL 350 MG/ML SOLN COMPARISON:  Abdominopelvic CT 11/27/2022, 11/04/2022 and 08/17/2021. FINDINGS: Lower chest: Stable mild atelectasis or scarring at both lung bases. The heart is mildly enlarged and there is aortic and coronary artery atherosclerosis. No significant pleural effusion. Hepatobiliary: The liver is normal in density without suspicious focal abnormality. No evidence of gallstones, gallbladder wall thickening or biliary dilatation. Pancreas: Mild pancreatic atrophy. No ductal dilatation or surrounding inflammation. Spleen: Normal in size without focal abnormality. Adrenals/Urinary Tract: Both adrenal glands appear normal. No evidence of urinary tract calculus, suspicious renal lesion or  hydronephrosis. Stable small right renal cyst for which no follow-up imaging is recommended. There is a small amount of air in the bladder lumen which may be iatrogenic although present on the  last 2 studies. No bladder wall thickening or surrounding inflammation identified. Stomach/Bowel: No enteric contrast administered. The stomach appears unremarkable for its degree of distention. No small bowel distension, wall thickening or surrounding inflammation. There are diverticular changes throughout the descending and sigmoid colon with interval increased wall thickening and mild surrounding inflammation consistent with mild diverticulitis. There is possible mild rectal wall thickening as well. No evidence of peridiverticular abscess or bowel obstruction. Vascular/Lymphatic: There are no enlarged abdominal or pelvic lymph nodes. Severe aortic and branch vessel atherosclerosis with high-grade stenosis of the aortic lumen just inferior to renal arteries, similar to prior studies. No definite acute vascular findings. Reproductive: Hysterectomy.  No adnexal mass. Other: No evidence of abdominal wall hernia, ascites or free air. Musculoskeletal: No acute or significant osseous findings. Previous thoracic fusion, incompletely visualized. Multilevel facet arthropathy in the lower lumbar spine with chronic spinal stenosis at L3-4 and L4-5. Osteitis pubis. IMPRESSION: 1. Interval increased wall thickening and mild surrounding inflammation involving the descending and sigmoid colon consistent with mild diverticulitis. No evidence of peridiverticular abscess or bowel obstruction. 2. Possible mild rectal wall thickening. 3. Small amount of air in the bladder lumen is possibly iatrogenic, although has been present on the last 2 CTs. Given diverticulitis, the possibility of a colovesical fistula should be considered. No bladder wall thickening or surrounding inflammation identified. 4. Severe aortic atherosclerosis with high-grade  stenosis of the infrarenal aorta, placing the patient at risk for aortic occlusive disease, stable. Aortic Atherosclerosis (ICD10-I70.0). Electronically Signed   By: Richardean Sale M.D.   On: 12/06/2022 18:38    Pertinent labs & imaging results that were available during my care of the patient were reviewed by me and considered in my medical decision making (see MDM for details).  Medications Ordered in ED Medications  pantoprazole (PROTONIX) injection 40 mg (40 mg Intravenous Given 12/06/22 1642)  iohexol (OMNIPAQUE) 350 MG/ML injection 75 mL (75 mLs Intravenous Contrast Given 12/06/22 1821)  cefTRIAXone (ROCEPHIN) 1 g in sodium chloride 0.9 % 100 mL IVPB (0 g Intravenous Stopped 12/06/22 2203)                                                                                                                                     Procedures Procedures  (including critical care time)  Medical Decision Making / ED Course    Medical Decision Making:    Kristyann Braum is a 82 y.o. female  with past medical history as below, significant for hypertension, hypothyroid who presents to the ED with complaint of rectal bleeding, left-sided abdominal pain.. The complaint involves an extensive differential diagnosis and also carries with it a high risk of complications and morbidity.  Serious etiology was considered. Ddx includes but is not limited to: Differential diagnosis includes but is not exclusive to ectopic pregnancy, ovarian cyst, ovarian torsion, acute appendicitis, urinary tract infection, endometriosis, bowel obstruction, hernia, colitis, renal colic, gastroenteritis, volvulus  etc.   Complete initial physical exam performed, notably the patient  was no acute distress, resting comfortably, abdomen not peritoneal.    Reviewed and confirmed nursing documentation for past medical history, family history, social history.  Vital signs reviewed.    Clinical Course as of 12/06/22 2254  Nancy Fetter Dec 06, 2022   2214 CTAP with concern for diverticulitis, she has known hx of this, no perforation or abscess. Start abx. UA pending, pt denies any pneumaturia or malodorous vaginal discharge [SG]    Clinical Course User Index [SG] Jeanell Sparrow, DO   Patient with recurrent diverticulitis, recurrent rectal bleeding.  Hemoglobin stable, hemodynamically stable.  Sees gastroenterology in [facility] but are frustrated with care and seeking second opinion.  Patient with diverticulitis on CT, also UTI.  Low sufficient for colovesicular fistula, not passing stool through her urethra, no malodorous vaginal or urethral discharge, no pneumaturia.  Her BUN is somewhat elevated consistent with lower GI bleed.  Likely secondary to her diverticulitis.  No frank bleeding on exam.  HDS, hemoglobin stable.  Given general surgery follow-up given this is become more recurrent diverticulitis, may benefit from surgical evaluation  Give antibiotics, follow-up with general surgery and PCP. RTED if worse.   The patient improved significantly and was discharged in stable condition. Detailed discussions were had with the patient regarding current findings, and need for close f/u with PCP or on call doctor. The patient has been instructed to return immediately if the symptoms worsen in any way for re-evaluation. Patient verbalized understanding and is in agreement with current care plan. All questions answered prior to discharge.    Additional history obtained: -Additional history obtained from spouse -External records from outside source obtained and reviewed including: Chart review including previous notes, labs, imaging, consultation notes including admission, prior labs and imaging, medications prior admission documentation   Lab Tests: -I ordered, reviewed, and interpreted labs.   The pertinent results include:   Labs Reviewed  CBC WITH DIFFERENTIAL/PLATELET - Abnormal; Notable for the following components:      Result Value    WBC 15.9 (*)    RBC 3.68 (*)    Hemoglobin 10.7 (*)    HCT 34.3 (*)    Neutro Abs 11.1 (*)    Monocytes Absolute 1.6 (*)    All other components within normal limits  COMPREHENSIVE METABOLIC PANEL - Abnormal; Notable for the following components:   Sodium 133 (*)    Glucose, Bld 118 (*)    BUN 52 (*)    Creatinine, Ser 1.03 (*)    Total Protein 6.2 (*)    Albumin 3.1 (*)    AST 49 (*)    GFR, Estimated 55 (*)    All other components within normal limits  URINALYSIS, ROUTINE W REFLEX MICROSCOPIC - Abnormal; Notable for the following components:   APPearance HAZY (*)    Hgb urine dipstick SMALL (*)    Leukocytes,Ua LARGE (*)    Bacteria, UA FEW (*)    Non Squamous Epithelial 0-5 (*)    All other components within normal limits  URINE CULTURE  LIPASE, BLOOD  PROTIME-INR  OCCULT BLOOD X 1 CARD TO LAB, STOOL  TYPE AND SCREEN    Notable for leukocytosis, UTI, hgb stable  EKG   EKG Interpretation  Date/Time:    Ventricular Rate:    PR Interval:    QRS Duration:   QT Interval:    QTC Calculation:   R Axis:     Text  Interpretation:           Imaging Studies ordered: I ordered imaging studies including CTAP I independently visualized the following imaging with scope of interpretation limited to determining acute life threatening conditions related to emergency care; findings noted above, significant for diverticulitis I independently visualized and interpreted imaging. I agree with the radiologist interpretation   Medicines ordered and prescription drug management: Meds ordered this encounter  Medications   pantoprazole (PROTONIX) injection 40 mg   iohexol (OMNIPAQUE) 350 MG/ML injection 75 mL   cefTRIAXone (ROCEPHIN) 1 g in sodium chloride 0.9 % 100 mL IVPB    Order Specific Question:   Antibiotic Indication:    Answer:   Intra-abdominal   amoxicillin-clavulanate (AUGMENTIN) 875-125 MG tablet    Sig: Take 1 tablet by mouth every 12 (twelve) hours for 10  days.    Dispense:  20 tablet    Refill:  0    -I have reviewed the patients home medicines and have made adjustments as needed   Consultations Obtained: na   Cardiac Monitoring: The patient was maintained on a cardiac monitor.  I personally viewed and interpreted the cardiac monitored which showed an underlying rhythm of: SR  Social Determinants of Health:  Diagnosis or treatment significantly limited by social determinants of health: obesity   Reevaluation: After the interventions noted above, I reevaluated the patient and found that they have improved  Co morbidities that complicate the patient evaluation  Past Medical History:  Diagnosis Date   Anemia    Anxiety    Arthritis    Complication of anesthesia    Depression    Difficult intubation    fiberoptic intubation in the setting of C2 fx 08/29/12; now s/p C1-2 and T2-8 fusion (has limited neck ROM)   GERD (gastroesophageal reflux disease)    Headache    High cholesterol    controlled with Atorvastatin   History of bronchitis    History of pneumonia    Hypertension    Hyponatremia    states that sodium level drops after surgery   Hypothyroidism    Numbness and tingling    hands and feet   Peripheral neuropathy    Restless leg syndrome    Urinary incontinence    Urinary urgency       Dispostion: Disposition decision including need for hospitalization was considered, and patient discharged from emergency department.    Final Clinical Impression(s) / ED Diagnoses Final diagnoses:  Cystitis  Diverticulitis     This chart was dictated using voice recognition software.  Despite best efforts to proofread,  errors can occur which can change the documentation meaning.

## 2022-12-06 NOTE — ED Triage Notes (Signed)
Pt brought in by EMS from home with rectal bleeding. Pt has had ongoing issues with this 'for years' , was last discharged from [facility] last week. Complaining that today the rectal bleeding is 'a lot more' than it usually is

## 2022-12-06 NOTE — ED Notes (Signed)
Patient verbalizes understanding of discharge instructions. Opportunity for questioning and answers were provided. Armband removed by staff, pt discharged from ED. Pt taken to ED entrance via wheel chair.  

## 2022-12-06 NOTE — Discharge Instructions (Addendum)
It was a pleasure caring for you today in the emergency department.  Please follow-up with general surgery in regards to recurrent diverticulitis  Please return to the emergency department for any worsening or worrisome symptoms.

## 2022-12-06 NOTE — ED Notes (Signed)
Lab called to add on urine culture. Requisition sent to lab.

## 2022-12-07 LAB — URINE CULTURE: Culture: NO GROWTH

## 2023-10-09 DEATH — deceased

## 2023-11-19 IMAGING — CT CT HEAD W/O CM
3 of 4 series · 16 of 47 positions shown, 19 images · non-contrast
Comparison: 07/21/2018

CLINICAL DATA: Head trauma, minor (Age >= 65y).  Fall.

EXAM:
CT HEAD WITHOUT CONTRAST
TECHNIQUE: Contiguous axial images were obtained from the base of the skull
through the vertex without intravenous contrast.

[Series 3: head 2.0 h70h · axial · 0.41mm/px · z∈[+892,+1056]mm · 10 of 92 slices shown, 13 images]
[im 5/92  brain]
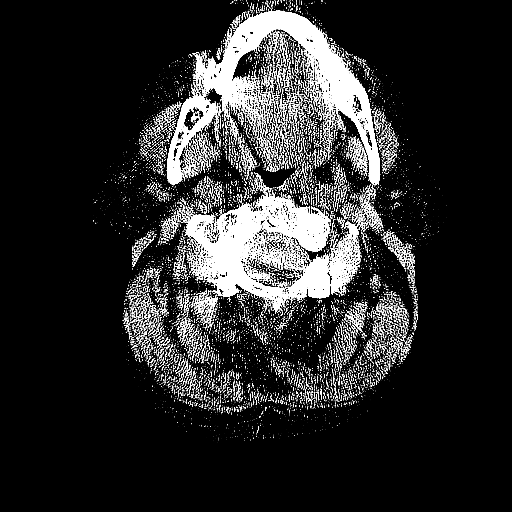
[im 5/92  bone]
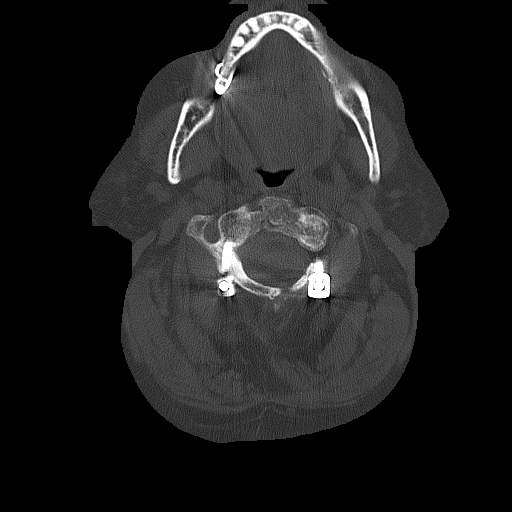
[im 14/92  brain]
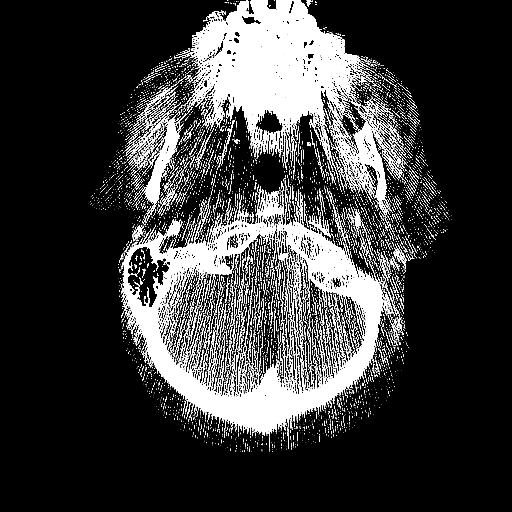
[im 23/92  brain]
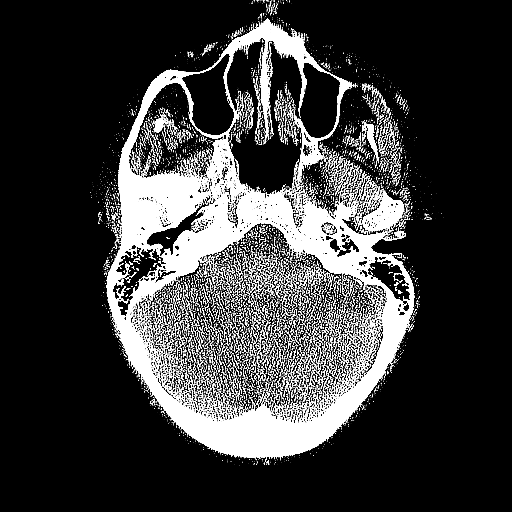
[im 32/92  brain]
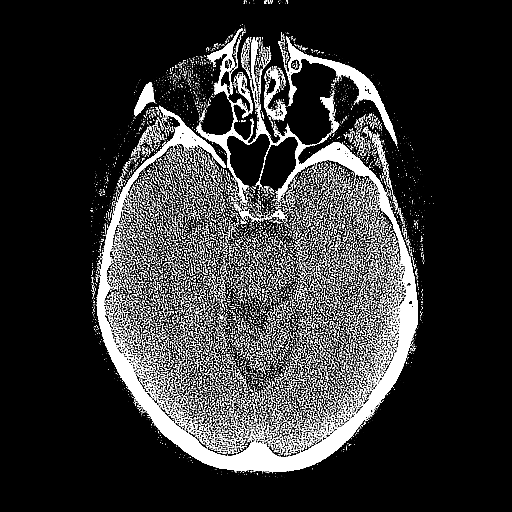
[im 41/92  brain]
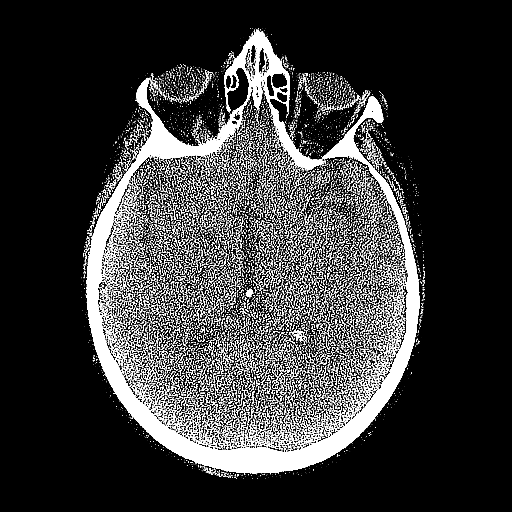
[im 41/92  bone]
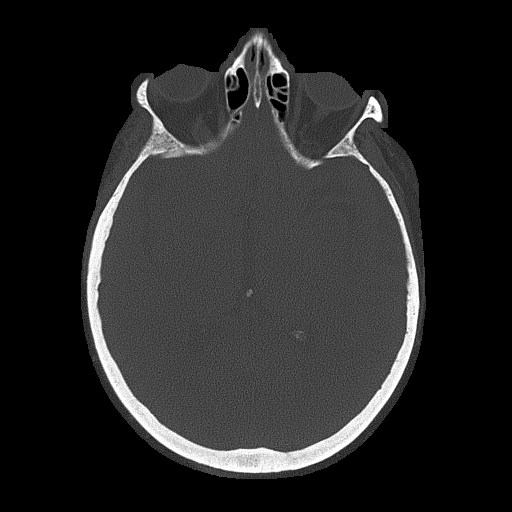
[im 51/92  brain]
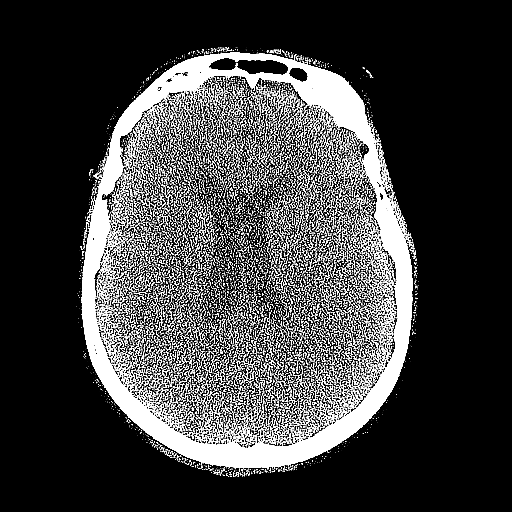
[im 60/92  brain]
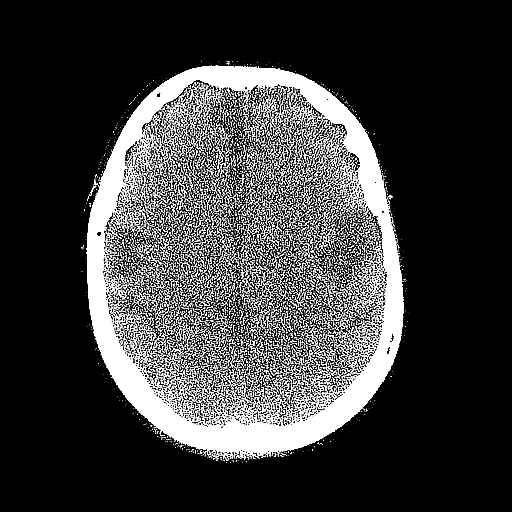
[im 69/92  brain]
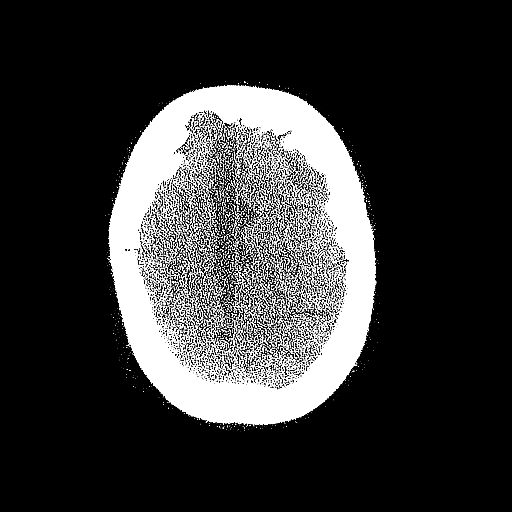
[im 78/92  brain]
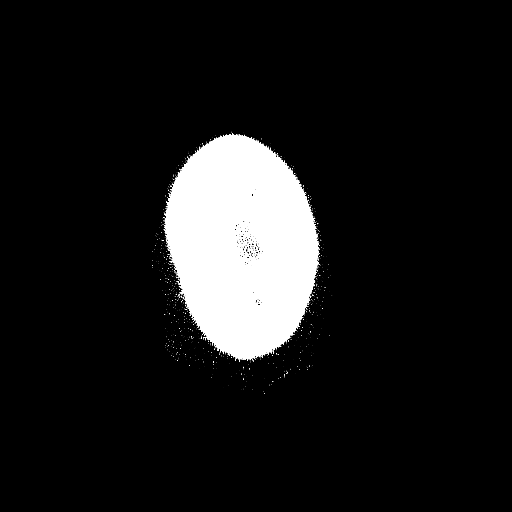
[im 78/92  bone]
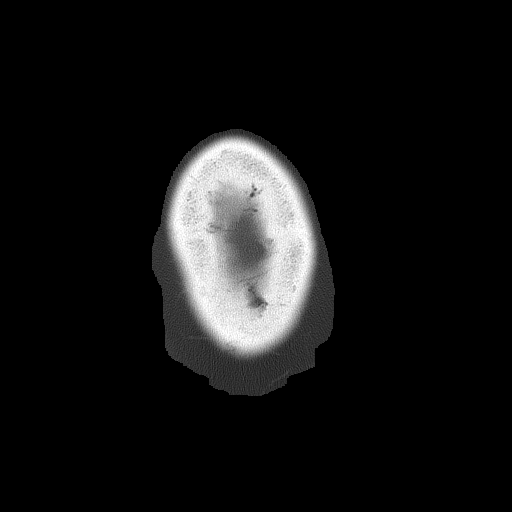
[im 87/92  brain]
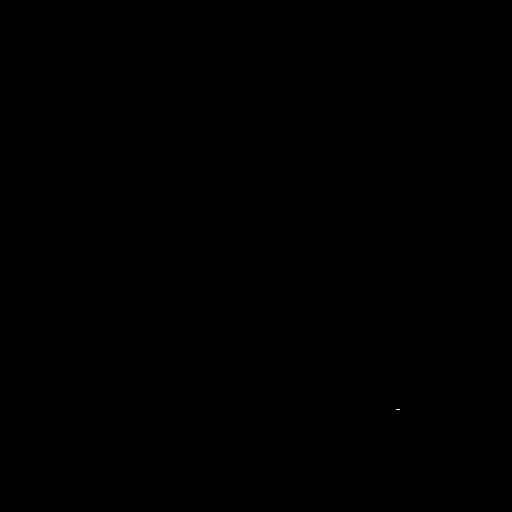

[Series 4: head 3.0 mpr cor · coronal · 0.36mm/px · 3 of 67 slices shown]
[im 23/67  brain]
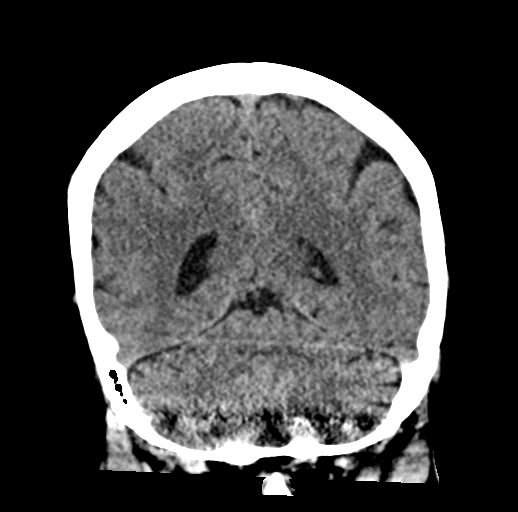
[im 30/67  brain]
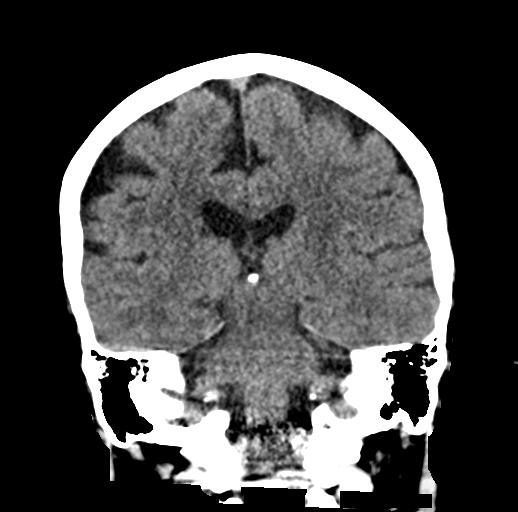
[im 37/67  brain]
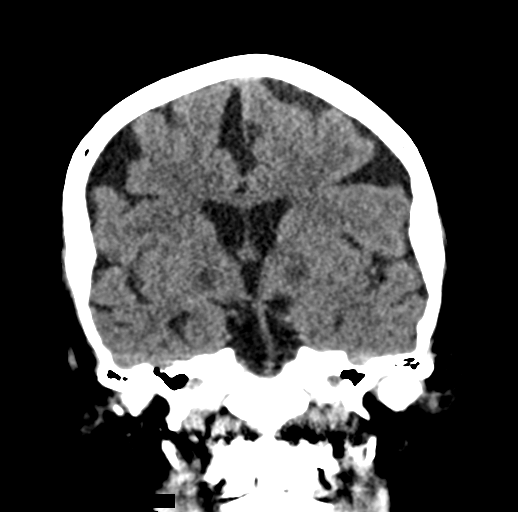

[Series 5: head 3.0 mpr sag · sagittal · 0.36mm/px · 3 of 57 slices shown]
[im 19/57  brain]
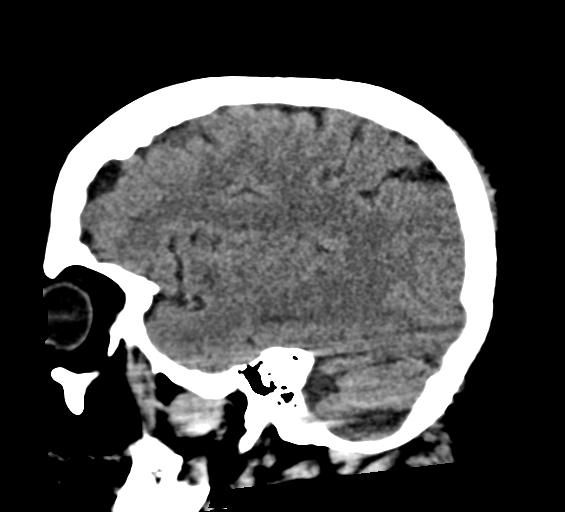
[im 29/57  brain]
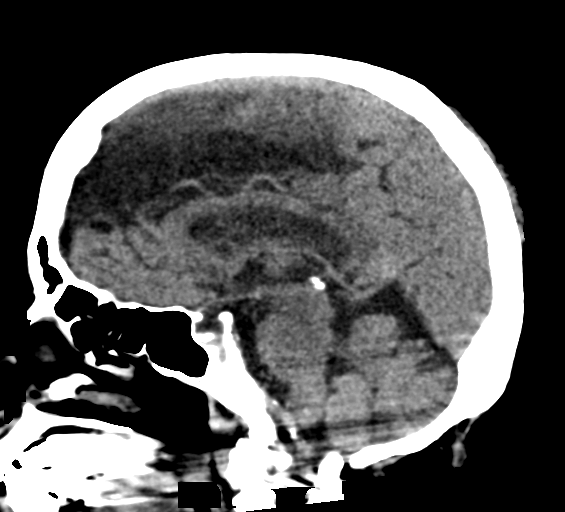
[im 38/57  brain]
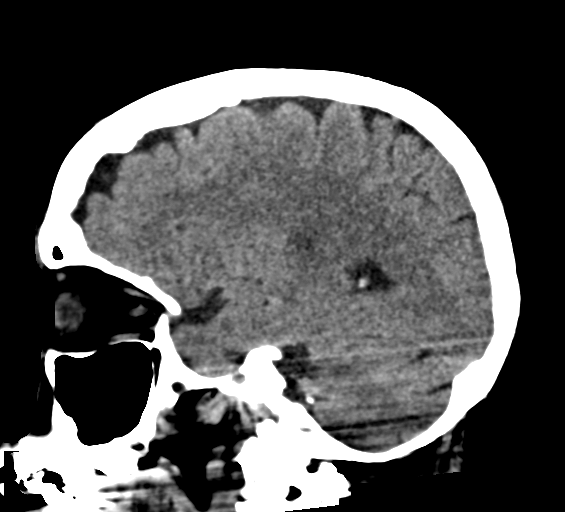

[16 of 47 positions shown; findings below may reference images not displayed]

FINDINGS: Brain: Diffuse cerebral atrophy. Chronic hypodensity in the globus
pallidus bilaterally. No acute intracranial abnormality.
Specifically, no hemorrhage, hydrocephalus, mass lesion, acute
infarction, or significant intracranial injury.

Vascular: No hyperdense vessel or unexpected calcification.

Skull: No acute calvarial abnormality.

Sinuses/Orbits: No acute findings

Other: None
IMPRESSION: No acute intracranial abnormality.

## 2023-11-19 IMAGING — CT CT CHEST-ABD-PELV W/O CM
2 of 4 series · 14 of 36 positions shown, 16 images · non-contrast
Comparison: CT abdomen pelvis dated 05/15/2020 [address] chest CT dated
12/19/2020.

CLINICAL DATA: Trauma.

EXAM:
CT CHEST, ABDOMEN AND PELVIS WITHOUT CONTRAST
TECHNIQUE: Multidetector CT imaging of the chest, abdomen and pelvis was
performed following the standard protocol without IV contrast.

[Series 2: axial st · axial · 0.85mm/px · z∈[+306,+790]mm · 11 of 117 slices shown, 13 images]
[im 10/117  mediastinal]
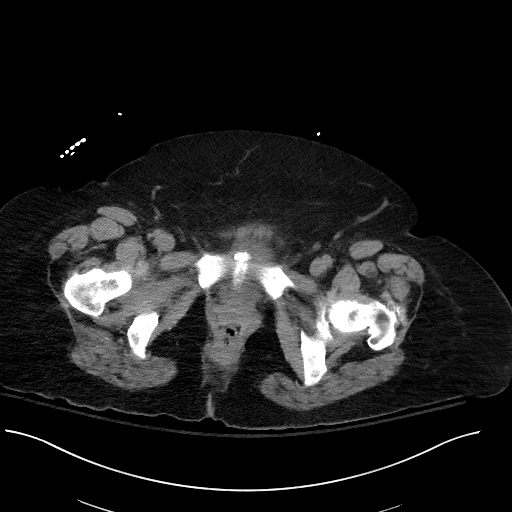
[im 10/117  bone]
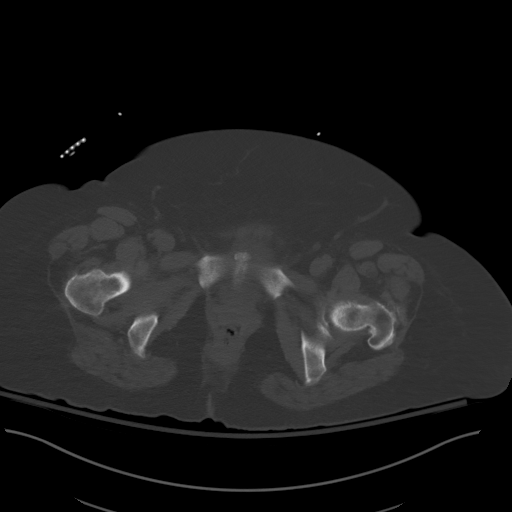
[im 20/117  mediastinal]
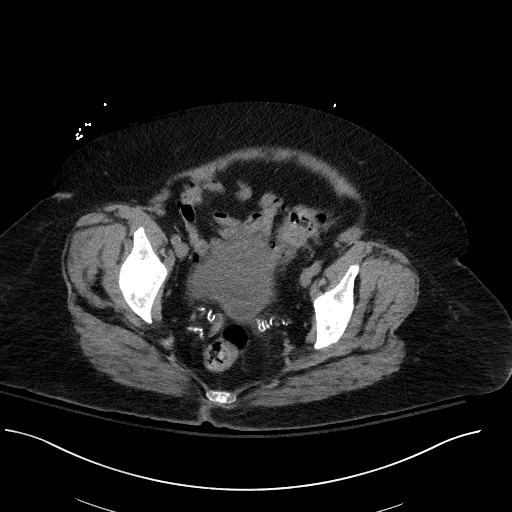
[im 30/117  mediastinal]
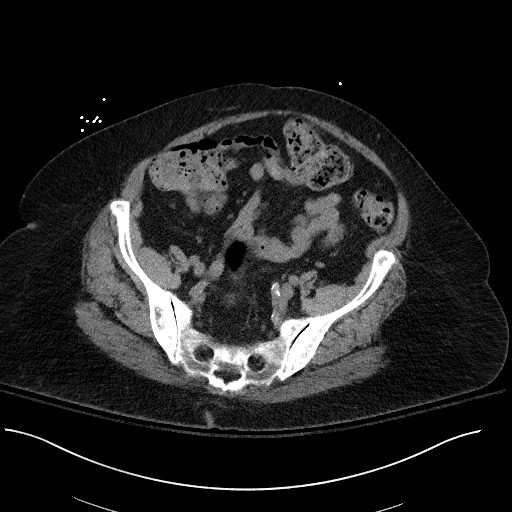
[im 39/117  mediastinal]
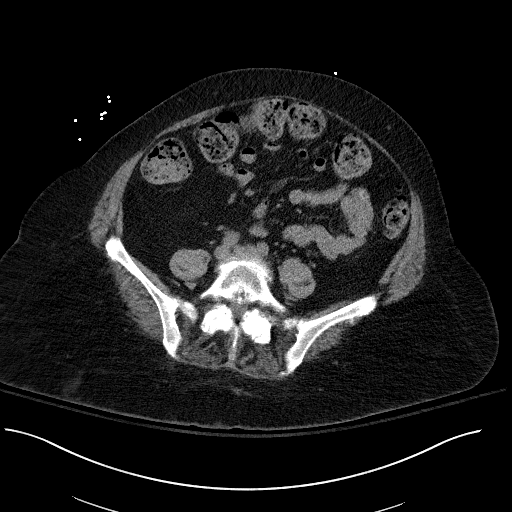
[im 49/117  mediastinal]
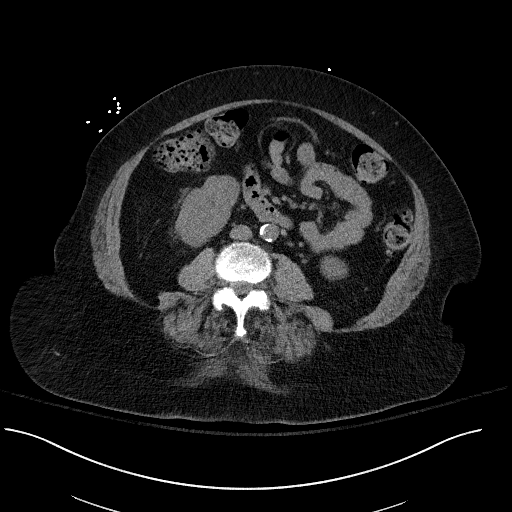
[im 59/117  mediastinal]
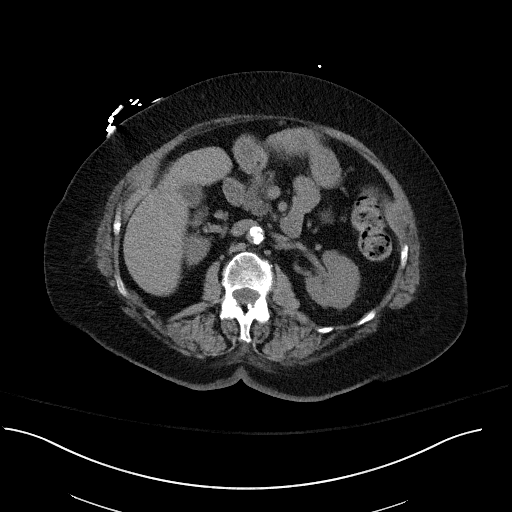
[im 68/117  mediastinal]
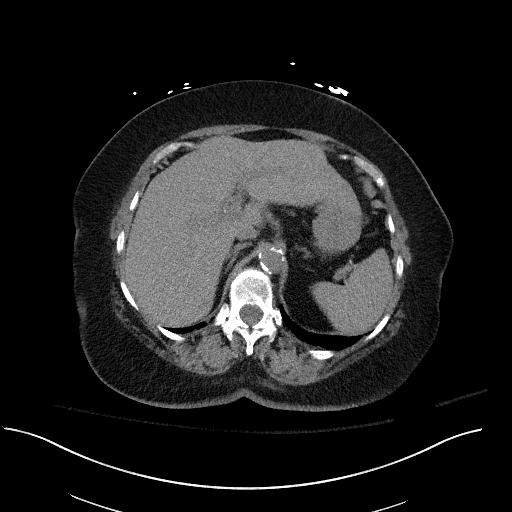
[im 78/117  mediastinal]
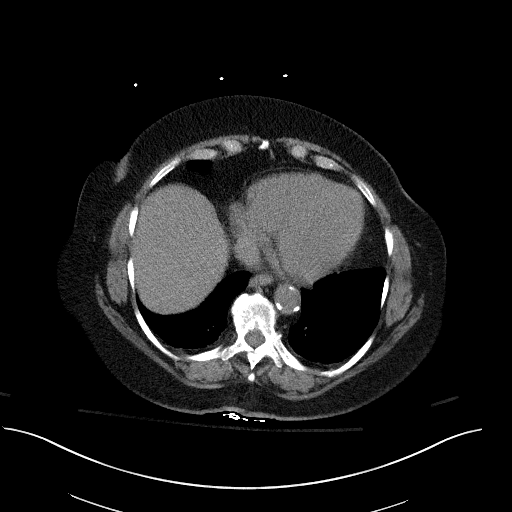
[im 88/117  mediastinal]
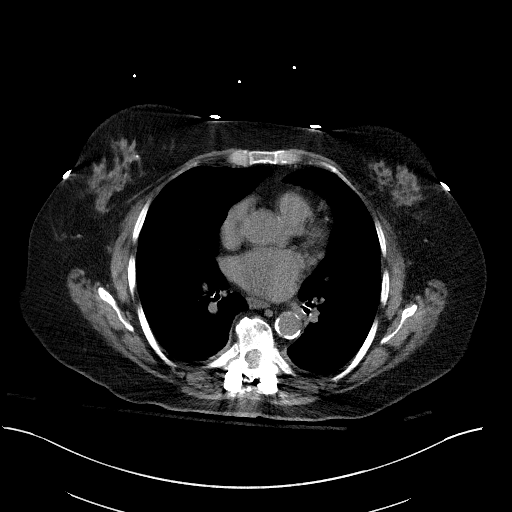
[im 88/117  bone]
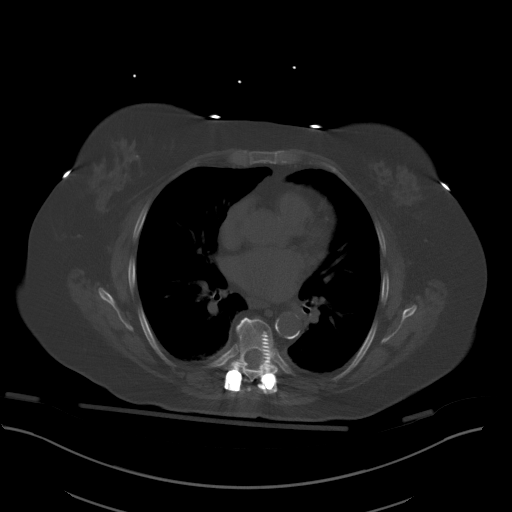
[im 97/117  mediastinal]
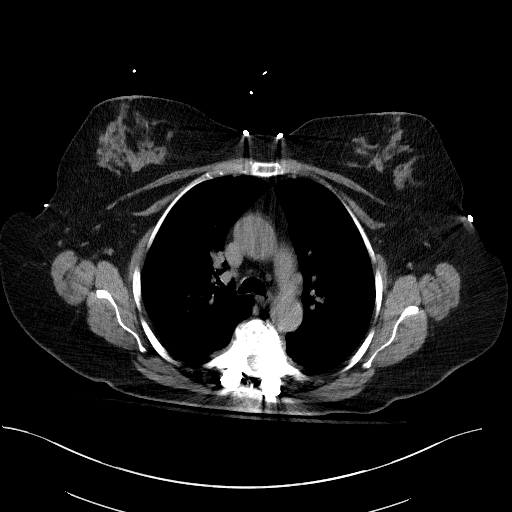
[im 107/117  mediastinal]
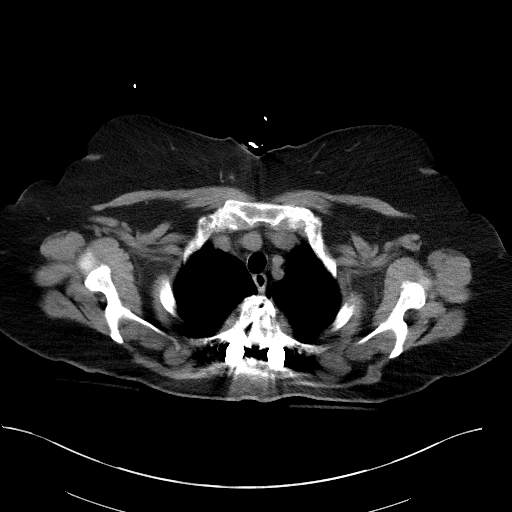

[Series 5: coronal · coronal · 0.85mm/px · 3 of 163 slices shown]
[im 33/163  mediastinal]
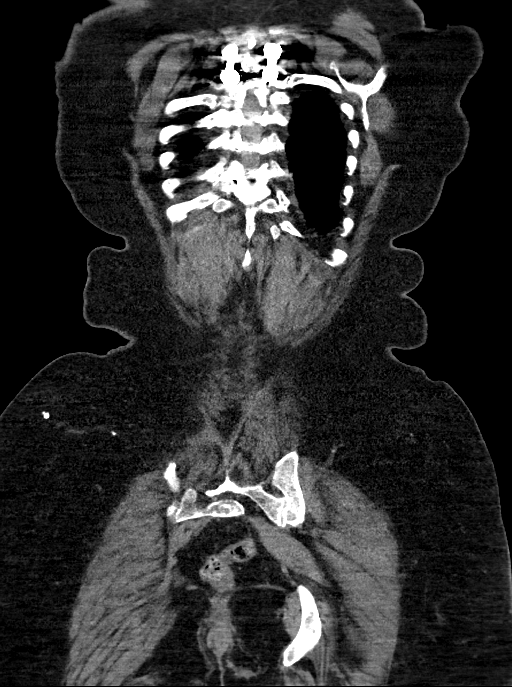
[im 65/163  mediastinal]
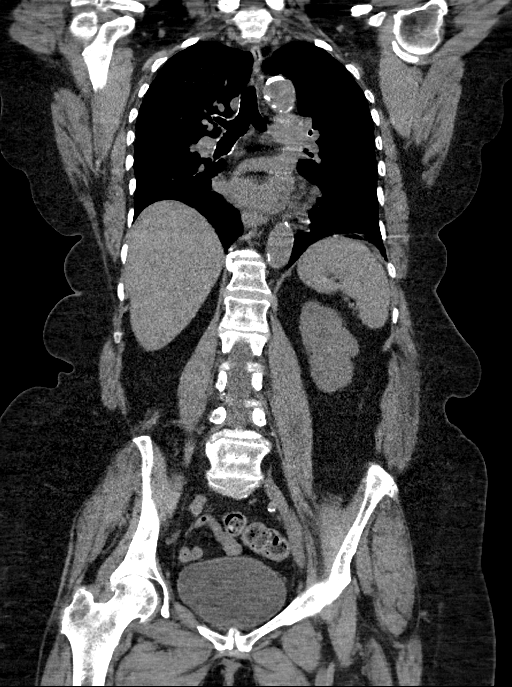
[im 98/163  mediastinal]
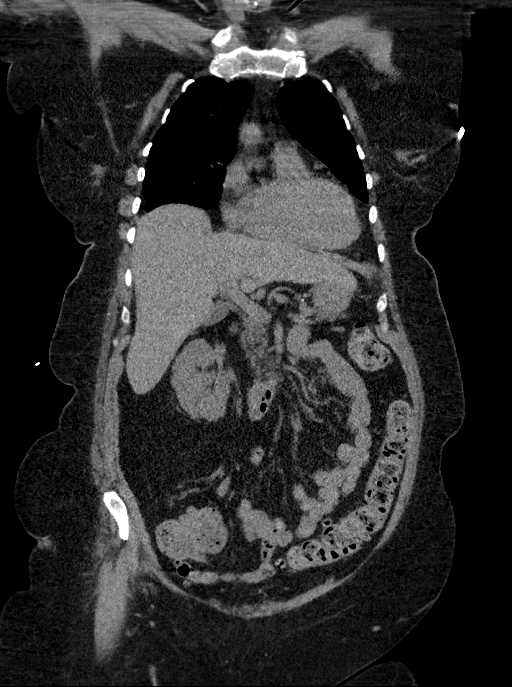

[14 of 36 positions shown; findings below may reference images not displayed]

FINDINGS: Evaluation of this exam is limited in the absence of intravenous
contrast.

CT CHEST FINDINGS

Cardiovascular: No cardiomegaly or pericardial effusion. There is
coronary vascular calcification of the LAD. Moderate atherosclerotic
calcification of the thoracic aorta. No aneurysmal dilatation. The
central pulmonary arteries are grossly unremarkable on this
noncontrast CT.

Mediastinum/Nodes: No hilar or mediastinal adenopathy. The esophagus
is grossly unremarkable. No mediastinal fluid collection.

Lungs/Pleura: The lungs are clear. There is no pleural effusion
pneumothorax. The central airways are patent.

Musculoskeletal: There is degenerative changes of the spine and
osteopenia. Old T5 compression fracture as well as prior thoracic
posterior fusion. No acute osseous pathology.

CT ABDOMEN PELVIS FINDINGS

No intra-abdominal free air or free fluid.

Hepatobiliary: No focal liver abnormality is seen. No gallstones,
gallbladder wall thickening, or biliary dilatation.

Pancreas: Unremarkable. No pancreatic ductal dilatation or
surrounding inflammatory changes.

Spleen: Normal in size without focal abnormality.

Adrenals/Urinary Tract: The adrenal glands unremarkable. There is no
hydronephrosis or nephrolithiasis on either side. The visualized
ureters and urinary bladder appear unremarkable.

Stomach/Bowel: Sigmoid diverticulosis without active inflammatory
changes. There is moderate stool throughout the colon. No bowel
obstruction or active inflammation. The appendix is normal.

Vascular/Lymphatic: Moderate aortoiliac atherosclerotic disease. The
IVC is unremarkable. No portal venous gas. There is no adenopathy.

Reproductive: Hysterectomy.

Other: None

Musculoskeletal: Degenerative changes of the spine. No acute osseous
pathology.
IMPRESSION: 1. No acute intrathoracic, abdominal, or pelvic pathology. No
hydronephrosis or nephrolithiasis.
2. Sigmoid diverticulosis. No bowel obstruction. Normal appendix.
3. Aortic Atherosclerosis ([id number]).
